# Patient Record
Sex: Male | Born: 1985 | Race: Black or African American | Hispanic: No | Marital: Single | Smoking: Former smoker
Health system: Southern US, Community
[De-identification: ages and names within clinical notes are randomized; demographics above are authoritative.]

## PROBLEM LIST (undated history)

## (undated) DIAGNOSIS — K219 Gastro-esophageal reflux disease without esophagitis: Secondary | ICD-10-CM

## (undated) DIAGNOSIS — T7840XA Allergy, unspecified, initial encounter: Secondary | ICD-10-CM

## (undated) HISTORY — DX: Allergy, unspecified, initial encounter: T78.40XA

## (undated) HISTORY — PX: DENTAL SURGERY: SHX609

## (undated) HISTORY — PX: MOUTH SURGERY: SHX715

---
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
## 2007-05-22 ENCOUNTER — Emergency Department: Payer: Self-pay | Admitting: Emergency Medicine

## 2007-05-22 ENCOUNTER — Other Ambulatory Visit: Payer: Self-pay

## 2007-05-24 ENCOUNTER — Emergency Department: Payer: Self-pay | Admitting: Emergency Medicine

## 2007-05-24 ENCOUNTER — Other Ambulatory Visit: Payer: Self-pay

## 2007-06-09 ENCOUNTER — Emergency Department: Payer: Self-pay

## 2007-10-08 ENCOUNTER — Emergency Department: Payer: Self-pay | Admitting: Emergency Medicine

## 2007-10-11 ENCOUNTER — Emergency Department: Payer: Self-pay | Admitting: Emergency Medicine

## 2008-02-13 ENCOUNTER — Emergency Department (HOSPITAL_COMMUNITY): Admission: EM | Admit: 2008-02-13 | Discharge: 2008-02-13 | Payer: Self-pay | Admitting: *Deleted

## 2008-02-24 ENCOUNTER — Emergency Department: Payer: Self-pay | Admitting: Emergency Medicine

## 2008-09-28 IMAGING — CT CT HEAD WITHOUT CONTRAST
1 series · 16 of 30 positions shown, 20 images · non-contrast
Comparison: none

REASON FOR EXAM: HA x 1 month
COMMENTS:

[Series 2: soft tissue · axial · 0.41mm/px · z∈[-176,-40]mm · 16 of 31 slices shown, 20 images]
[im 2/31  brain]
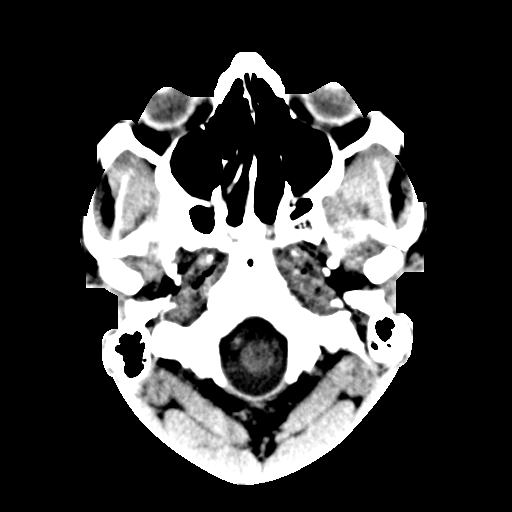
[im 2/31  bone]
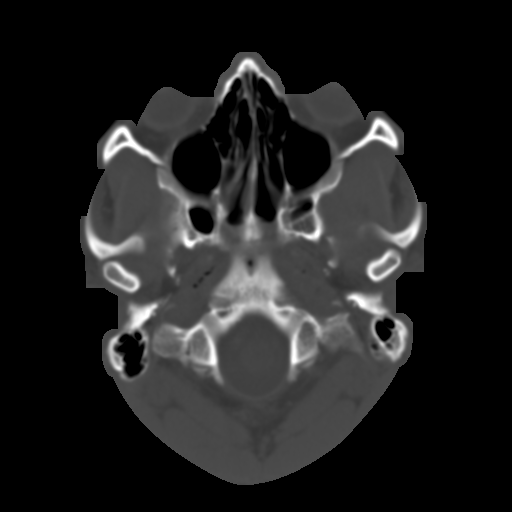
[im 4/31  brain]
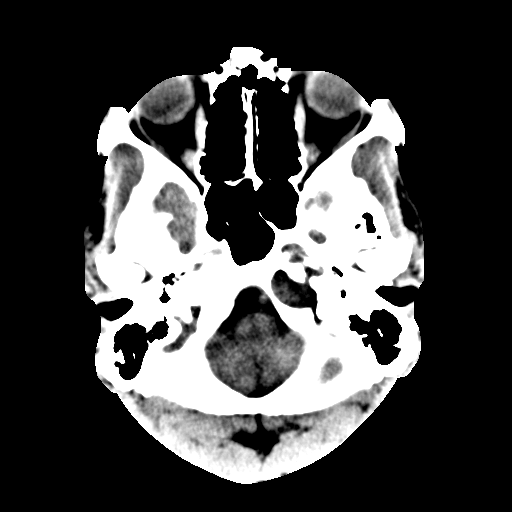
[im 6/31  brain]
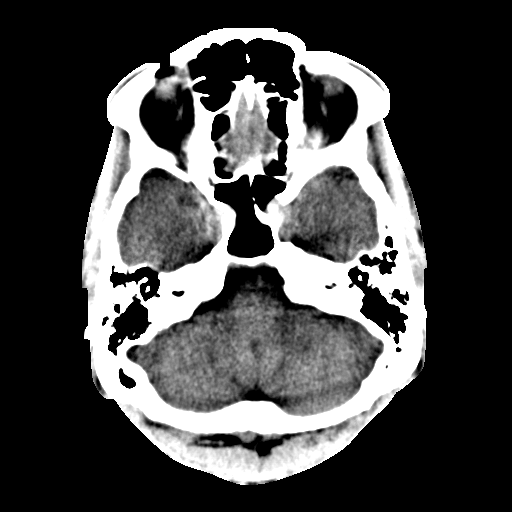
[im 8/31  brain]
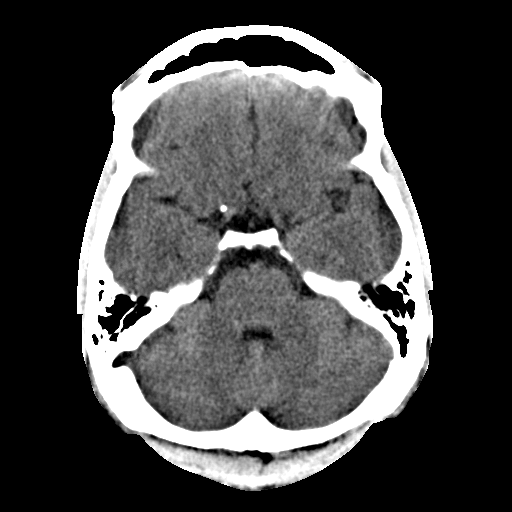
[im 9/31  brain]
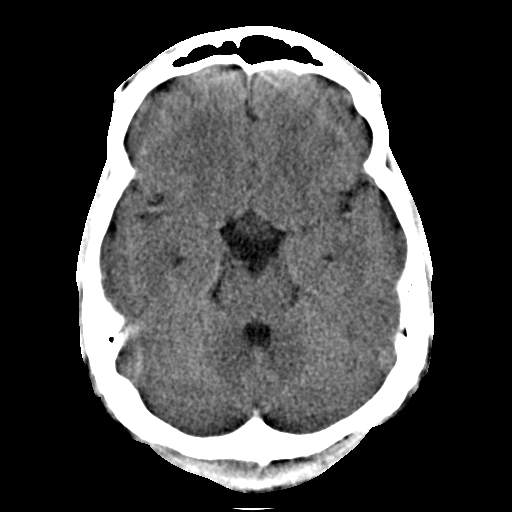
[im 9/31  bone]
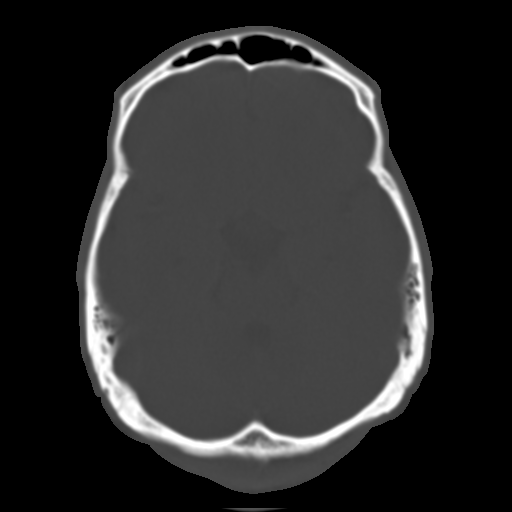
[im 11/31  brain]
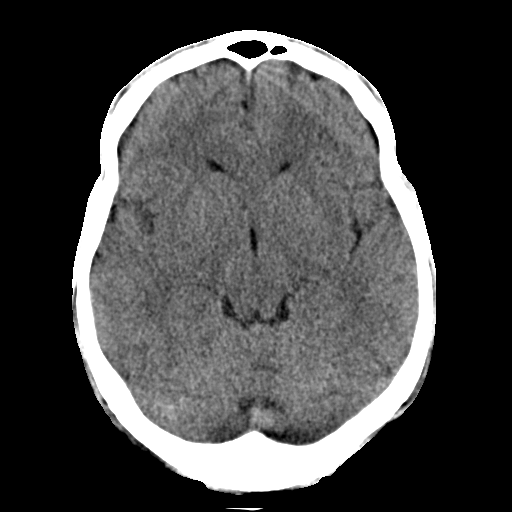
[im 13/31  brain]
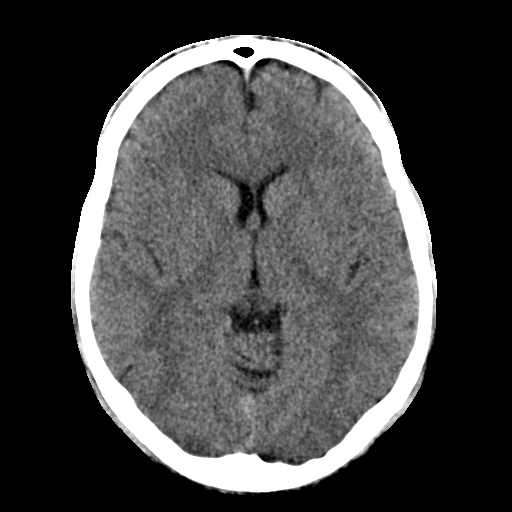
[im 15/31  brain]
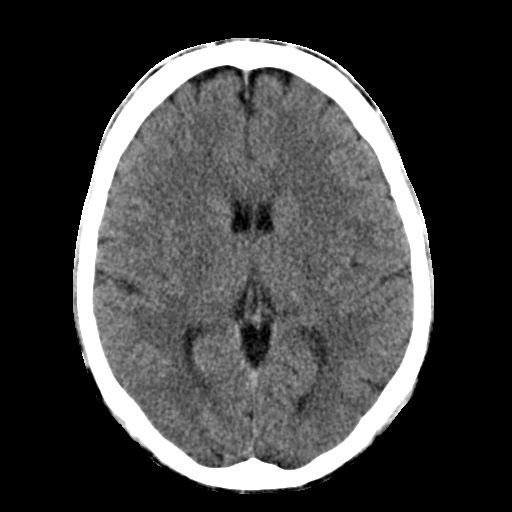
[im 16/31  brain]
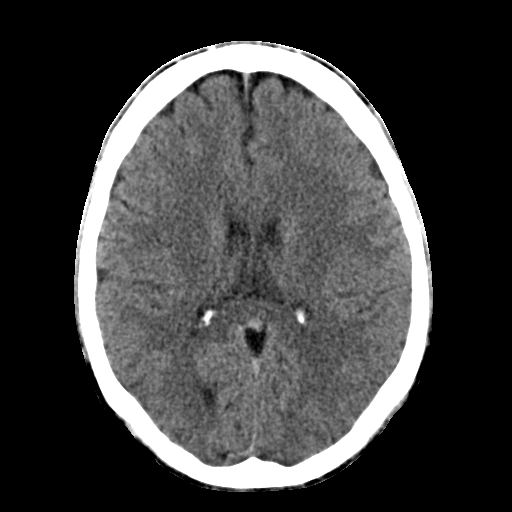
[im 16/31  bone]
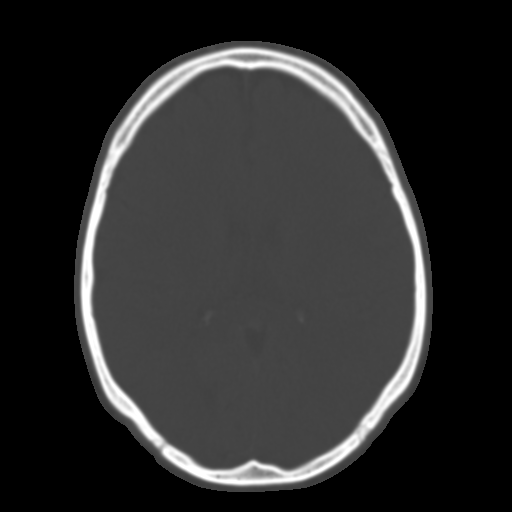
[im 18/31  brain]
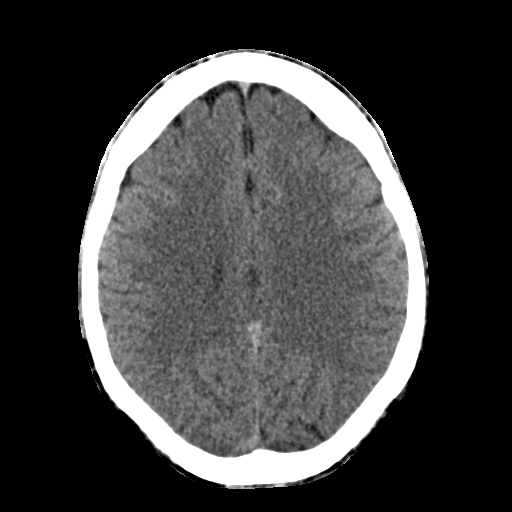
[im 20/31  brain]
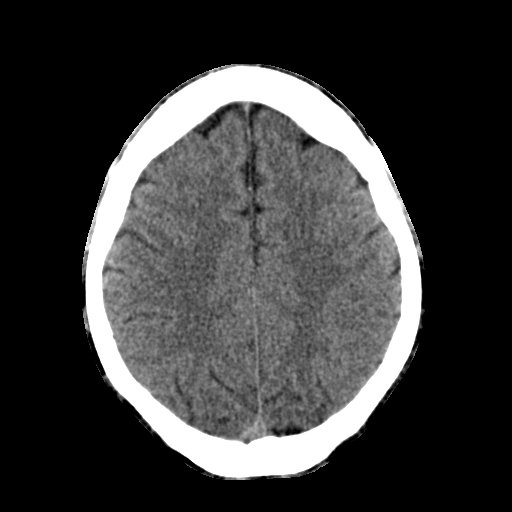
[im 22/31  brain]
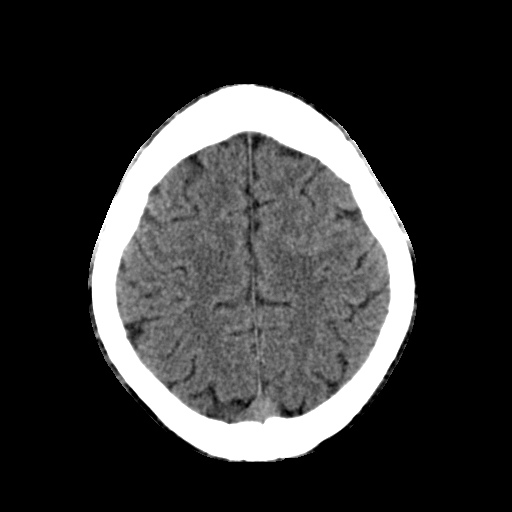
[im 23/31  brain]
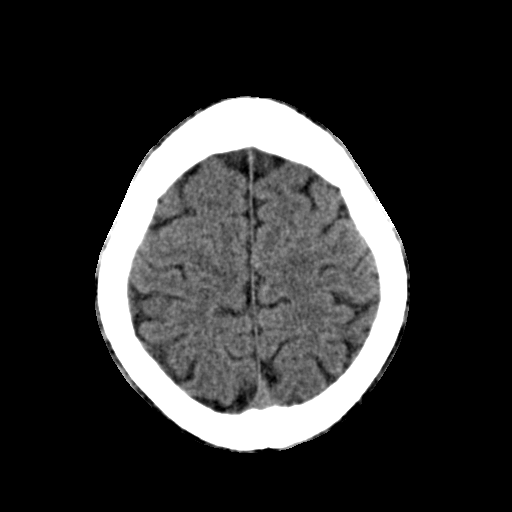
[im 23/31  bone]
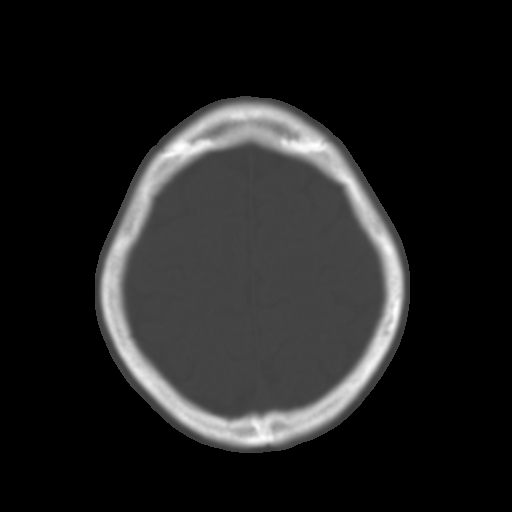
[im 25/31  brain]
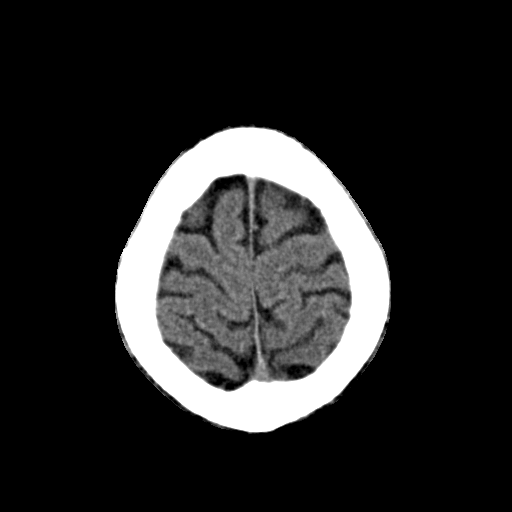
[im 27/31  brain]
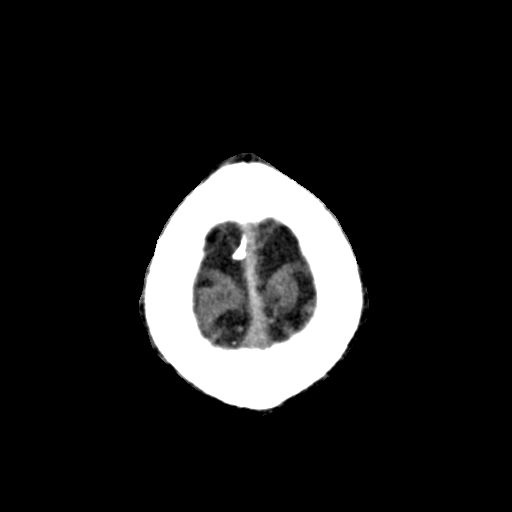
[im 29/31  brain]
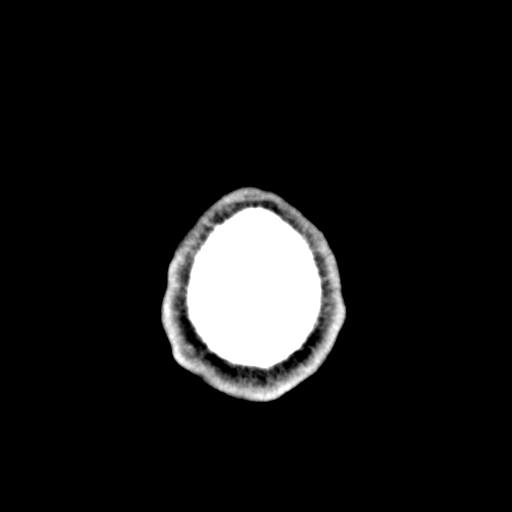

[16 of 30 positions shown; findings below may reference images not displayed]

PROCEDURE:     CT  - CT HEAD WITHOUT CONTRAST  - June 09, 2007  [DATE]

RESULT:     The ventricles are normal in size and position. There is no
intracranial hemorrhage, mass, or mass-effect. The cerebellum and brainstem
are normal in density. At bone window settings, the observed portions of the
paranasal sinuses are clear. The mastoid air cells are well pneumatized.
There is no lytic or blastic bony lesion.
IMPRESSION: I see no acute intracranial abnormality.

This report called to the [HOSPITAL] the conclusion of the
study.

## 2009-11-26 ENCOUNTER — Emergency Department: Payer: Self-pay | Admitting: Emergency Medicine

## 2010-08-04 ENCOUNTER — Emergency Department: Payer: Self-pay | Admitting: Emergency Medicine

## 2011-04-09 ENCOUNTER — Emergency Department (HOSPITAL_COMMUNITY)
Admission: EM | Admit: 2011-04-09 | Discharge: 2011-04-10 | Disposition: A | Discharge status: Home / Self Care | Payer: No Typology Code available for payment source | Attending: Emergency Medicine | Admitting: Emergency Medicine

## 2011-04-09 ENCOUNTER — Encounter: Payer: Self-pay | Admitting: Adult Health

## 2011-04-09 ENCOUNTER — Emergency Department (HOSPITAL_COMMUNITY): Payer: No Typology Code available for payment source

## 2011-04-09 DIAGNOSIS — Y92410 Unspecified street and highway as the place of occurrence of the external cause: Secondary | ICD-10-CM | POA: Insufficient documentation

## 2011-04-09 DIAGNOSIS — M542 Cervicalgia: Secondary | ICD-10-CM | POA: Insufficient documentation

## 2011-04-09 DIAGNOSIS — T1490XA Injury, unspecified, initial encounter: Secondary | ICD-10-CM | POA: Insufficient documentation

## 2011-04-09 MED ORDER — HYDROCODONE-ACETAMINOPHEN 5-325 MG PO TABS
1.0000 | ORAL_TABLET | Freq: Once | ORAL | Status: AC
  Administered 2011-04-09: 1 via ORAL
  Filled 2011-04-09: qty 1
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
## 2011-04-09 NOTE — ED Notes (Signed)
C/o of back and neck pain. No deformitites
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
## 2011-04-10 MED ORDER — HYDROCODONE-ACETAMINOPHEN 5-500 MG PO TABS: 1.0000 | ORAL_TABLET | Freq: Four times a day (QID) | ORAL | Status: AC | PRN

## 2011-04-10 MED ORDER — CYCLOBENZAPRINE HCL 10 MG PO TABS: 10.0000 mg | ORAL_TABLET | Freq: Two times a day (BID) | ORAL | Status: AC | PRN

## 2011-04-10 MED ORDER — IBUPROFEN 800 MG PO TABS: 800.0000 mg | ORAL_TABLET | Freq: Three times a day (TID) | ORAL | Status: AC
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
## 2013-01-18 ENCOUNTER — Emergency Department: Payer: Self-pay | Admitting: Internal Medicine

## 2013-01-18 LAB — COMPREHENSIVE METABOLIC PANEL
Albumin: 4.7 g/dL (ref 3.4–5.0)
Alkaline Phosphatase: 72 U/L (ref 50–136)
BUN: 17 mg/dL (ref 7–18)
Calcium, Total: 9.1 mg/dL (ref 8.5–10.1)
Chloride: 104 mmol/L (ref 98–107)
EGFR (Non-African Amer.): >60
Glucose: 97 mg/dL (ref 65–99)
SGOT(AST): 78 U/L — ABNORMAL HIGH (ref 15–37)
SGPT (ALT): 63 U/L (ref 12–78)
Sodium: 138 mmol/L (ref 136–145)
Total Protein: 7.7 g/dL (ref 6.4–8.2)

## 2013-01-18 LAB — CBC
HCT: 47.3 % (ref 40.0–52.0)
MCHC: 33.6 g/dL (ref 32.0–36.0)
MCV: 83 fL (ref 80–100)
Platelet: 210 10*3/uL (ref 150–440)

## 2013-01-18 LAB — URINALYSIS, COMPLETE
Bacteria: NONE SEEN
Bilirubin,UR: NEGATIVE
Leukocyte Esterase: NEGATIVE
Protein: NEGATIVE
RBC,UR: 1 /HPF (ref 0–5)
Specific Gravity: 1.025 (ref 1.003–1.030)
Squamous Epithelial: <1
WBC UR: 3 /HPF (ref 0–5)

## 2013-01-18 LAB — LIPASE, BLOOD: Lipase: 222 U/L (ref 73–393)

## 2013-11-17 ENCOUNTER — Emergency Department: Payer: Self-pay | Admitting: Emergency Medicine

## 2014-01-12 ENCOUNTER — Emergency Department: Payer: Self-pay | Admitting: Emergency Medicine

## 2014-03-04 ENCOUNTER — Emergency Department: Payer: Self-pay | Admitting: Emergency Medicine

## 2014-03-04 LAB — GC/CHLAMYDIA PROBE AMP

## 2014-11-04 ENCOUNTER — Emergency Department (HOSPITAL_COMMUNITY)
Admission: EM | Admit: 2014-11-04 | Discharge: 2014-11-04 | Disposition: A | Discharge status: Home / Self Care | Payer: Self-pay | Attending: Emergency Medicine | Admitting: Emergency Medicine

## 2014-11-04 ENCOUNTER — Emergency Department (HOSPITAL_COMMUNITY): Payer: Self-pay

## 2014-11-04 ENCOUNTER — Encounter (HOSPITAL_COMMUNITY): Payer: Self-pay

## 2014-11-04 DIAGNOSIS — K2970 Gastritis, unspecified, without bleeding: Secondary | ICD-10-CM | POA: Insufficient documentation

## 2014-11-04 DIAGNOSIS — R109 Unspecified abdominal pain: Secondary | ICD-10-CM

## 2014-11-04 DIAGNOSIS — Z87891 Personal history of nicotine dependence: Secondary | ICD-10-CM | POA: Insufficient documentation

## 2014-11-04 DIAGNOSIS — R079 Chest pain, unspecified: Secondary | ICD-10-CM | POA: Insufficient documentation

## 2014-11-04 LAB — CBC WITH DIFFERENTIAL/PLATELET
BASOS PCT: 0 % (ref 0–1)
Basophils Absolute: 0 10*3/uL (ref 0.0–0.1)
EOS PCT: 1 % (ref 0–5)
Eosinophils Absolute: 0.1 10*3/uL (ref 0.0–0.7)
HCT: 43.1 % (ref 39.0–52.0)
HEMOGLOBIN: 15 g/dL (ref 13.0–17.0)
LYMPHS ABS: 1.6 10*3/uL (ref 0.7–4.0)
Lymphocytes Relative: 27 % (ref 12–46)
MCH: 28.7 pg (ref 26.0–34.0)
MCHC: 34.8 g/dL (ref 30.0–36.0)
MCV: 82.4 fL (ref 78.0–100.0)
MONO ABS: 0.4 10*3/uL (ref 0.1–1.0)
MONOS PCT: 7 % (ref 3–12)
NEUTROS ABS: 3.8 10*3/uL (ref 1.7–7.7)
NEUTROS PCT: 65 % (ref 43–77)
PLATELETS: 176 10*3/uL (ref 150–400)
RBC: 5.23 MIL/uL (ref 4.22–5.81)
RDW: 13.2 % (ref 11.5–15.5)
WBC: 5.8 10*3/uL (ref 4.0–10.5)

## 2014-11-04 LAB — COMPREHENSIVE METABOLIC PANEL WITH GFR
ALT: 19 U/L (ref 17–63)
AST: 20 U/L (ref 15–41)
Albumin: 4 g/dL (ref 3.5–5.0)
Alkaline Phosphatase: 59 U/L (ref 38–126)
Anion gap: 6 (ref 5–15)
BUN: 8 mg/dL (ref 6–20)
CO2: 31 mmol/L (ref 22–32)
Calcium: 9 mg/dL (ref 8.9–10.3)
Chloride: 101 mmol/L (ref 101–111)
Creatinine, Ser: 1.16 mg/dL (ref 0.61–1.24)
GFR calc Af Amer: >60 mL/min
GFR calc non Af Amer: >60 mL/min
Glucose, Bld: 121 mg/dL — ABNORMAL HIGH (ref 65–99)
Potassium: 3.6 mmol/L (ref 3.5–5.1)
Sodium: 138 mmol/L (ref 135–145)
Total Bilirubin: 1.6 mg/dL — ABNORMAL HIGH (ref 0.3–1.2)
Total Protein: 6.4 g/dL — ABNORMAL LOW (ref 6.5–8.1)

## 2014-11-04 LAB — LIPASE, BLOOD: LIPASE: 39 U/L (ref 22–51)

## 2014-11-04 LAB — I-STAT TROPONIN, ED: Troponin i, poc: 0 ng/mL (ref 0.00–0.08)

## 2014-11-04 MED ORDER — GI COCKTAIL ~~LOC~~
30.0000 mL | Freq: Once | ORAL | Status: AC
  Administered 2014-11-04: 30 mL via ORAL
  Filled 2014-11-04: qty 30

## 2014-11-04 MED ORDER — LANSOPRAZOLE 30 MG PO CPDR: 30.0000 mg | DELAYED_RELEASE_CAPSULE | Freq: Every day | ORAL | Status: DC

## 2014-11-04 NOTE — ED Provider Notes (Signed)
CSN: [id number]     Arrival date & time 11/04/14  0824 History   First MD Initiated Contact with Patient 11/04/14 [phone]     Chief Complaint  Patient presents with  . Chest Pain     (Consider location/radiation/quality/duration/timing/severity/associated sxs/prior Treatment) The history is provided by the patient.   Colin Glass is a 29 y.o. male presenting with a several month history of chest pain and dysphagia type symptoms, describing he frequently has midsternal chest pain triggered by bolus's of food and frequently has to drink water behind a food bolus to get it to pass.  He describes his symptoms are worse at night, describing a burning sensation and tightness in his chest, describing pain sometimes into his throat.  He denies frank reflux.  He denies shortness of breath.  He does have a recent history of EtOH use and smoking, states he quit using both of these products 3 weeks ago because of his symptoms with no significant improvement.  He has used omeprazole for the past 6 days, given by his father with no significant improvement.  Has had no nausea or vomiting, denies diarrhea, no GI bleeding.    History reviewed. No pertinent past medical history. History reviewed. No pertinent past surgical history. History reviewed. No pertinent family history. History  Substance Use Topics  . Smoking status: Former Smoker    Quit date: 10/21/2014  . Smokeless tobacco: Not on file  . Alcohol Use: No    Review of Systems  Constitutional: Negative.  Negative for fever and chills.  HENT: Negative.  Negative for congestion and sore throat.   Eyes: Negative.   Respiratory: Positive for chest tightness. Negative for shortness of breath.   Cardiovascular: Negative for chest pain.  Gastrointestinal: Positive for abdominal pain. Negative for nausea, vomiting and abdominal distention.  Genitourinary: Negative.  Negative for dysuria.  Musculoskeletal: Negative for joint swelling, arthralgias  and neck pain.  Skin: Negative.  Negative for rash and wound.  Neurological: Negative for dizziness, weakness, light-headedness, numbness and headaches.  Psychiatric/Behavioral: Negative.       Allergies  Review of patient's allergies indicates no known allergies.  Home Medications   Prior to Admission medications   Medication Sig Start Date End Date Taking? Authorizing Provider  albuterol (PROVENTIL HFA;VENTOLIN HFA) 108 (90 BASE) MCG/ACT inhaler Inhale 1-2 puffs into the lungs every 6 (six) hours as needed for wheezing or shortness of breath.   Yes Historical Provider, MD  ranitidine (ZANTAC) 75 MG tablet Take 75 mg by mouth as needed for heartburn.   Yes Historical Provider, MD  lansoprazole (PREVACID) 30 MG capsule Take 1 capsule (30 mg total) by mouth daily at 12 noon. 11/04/14   Evalee Jefferson, PA-C   BP 101/62 mmHg  Pulse 59  Temp(Src) 98.1 F (36.7 C) (Oral)  Resp 16  Ht 6\' 1"  (1.854 m)  Wt 180 lb (81.647 kg)  BMI 23.75 kg/m2  SpO2 99% Physical Exam  Constitutional: He appears well-developed and well-nourished.  HENT:  Head: Normocephalic and atraumatic.  Eyes: Conjunctivae are normal.  Neck: Normal range of motion.  Cardiovascular: Normal rate, regular rhythm, normal heart sounds and intact distal pulses.   Pulmonary/Chest: Effort normal and breath sounds normal. He has no wheezes. He exhibits no tenderness.  Abdominal: Soft. Bowel sounds are normal. He exhibits no distension. There is no tenderness. There is no rebound and no guarding.  Musculoskeletal: Normal range of motion.  Neurological: He is alert.  Skin: Skin is warm and  dry.  Psychiatric: He has a normal mood and affect.  Nursing note and vitals reviewed.   ED Course  Procedures (including critical care time) Labs Review Labs Reviewed  COMPREHENSIVE METABOLIC PANEL - Abnormal; Notable for the following:    Glucose, Bld 121 (*)    Total Protein 6.4 (*)    Total Bilirubin 1.6 (*)    All other components  within normal limits  CBC WITH DIFFERENTIAL/PLATELET  LIPASE, BLOOD  I-STAT TROPOININ, ED    Imaging Review Dg Abd Acute W/chest  11/04/2014   CLINICAL DATA:  Two-month history of mid chest pain and upper abdominal pain. Pain primarily post-prandial  EXAM: DG ABDOMEN ACUTE W/ 1V CHEST  COMPARISON:  Chest radiograph March 04, 2014  FINDINGS: PA chest: Lungs are clear. Heart size and pulmonary vascularity are normal. No adenopathy.  Supine and upright abdomen: There is moderate stool in the colon. There is no bowel dilatation or air-fluid level suggesting obstruction. No free air. There are phleboliths in the pelvis.  IMPRESSION: Bowel gas pattern unremarkable. No obstruction or free air. Lungs clear.   Electronically Signed   By: Lowella Grip III M.D.   On: 11/04/2014 09:58     EKG Interpretation   Date/Time:  Thursday November 04 2014 08:40:25 EDT Ventricular Rate:  62 PR Interval:  175 QRS Duration: 91 QT Interval:  396 QTC Calculation: 402 R Axis:   54 Text Interpretation:  Sinus rhythm RSR' in V1 or V2, probably normal  variant No significant change since last tracing Confirmed by HARRISON   MD, [place] ([id number]) on 11/04/2014 8:45:04 AM      MDM   Final diagnoses:  Abdominal pain  Gastritis    Patient was given a GI cocktail which improved symptoms for approximately 30 minutes.  Patients labs and/or radiological studies were reviewed and considered during the medical decision making and disposition process.  Results were also discussed with patient. Patient's labs are stable.  History and response to GI cocktail just acid reflux disease.  Acute abdominal series reveals no abnormal bowel gas pattern, no free air.  He has no guarding on exam.  He may have peptic ulcer disease but no suggestion of perforation.  More likely has generalized gastritis with reflux.  He will be placed on Protonix given a home trial of omeprazole was not helpful.  We'll also recommend Maalox or Mylanta  prior to meals.  The wellness clinic for follow-up care.  The patient appears reasonably screened and/or stabilized for discharge and I doubt any other medical condition or other [facility] requiring further screening, evaluation, or treatment in the ED at this time prior to discharge.     Evalee Jefferson, PA-C 11/04/14 1157  Pamella Pert, MD 11/04/14 1550

## 2014-11-04 NOTE — ED Notes (Signed)
Dr Harrison at bedside

## 2014-11-04 NOTE — ED Notes (Signed)
Pt reports central chest pain that radiates to throat that has been present for several months.  Pt reports it occurs after he eats and its worse at night.  Pain is described as a burning sensation.

## 2014-11-04 NOTE — Discharge Instructions (Signed)
Gastritis, Adult Gastritis is soreness and swelling (inflammation) of the lining of the stomach. Gastritis can develop as a sudden onset (acute) or long-term (chronic) condition. If gastritis is not treated, it can lead to stomach bleeding and ulcers. CAUSES  Gastritis occurs when the stomach lining is weak or damaged. Digestive juices from the stomach then inflame the weakened stomach lining. The stomach lining may be weak or damaged due to viral or bacterial infections. One common bacterial infection is the Helicobacter pylori infection. Gastritis can also result from excessive alcohol consumption, taking certain medicines, or having too much acid in the stomach.  SYMPTOMS  In some cases, there are no symptoms. When symptoms are present, they may include:  Pain or a burning sensation in the upper abdomen.  Nausea.  Vomiting.  An uncomfortable feeling of fullness after eating. DIAGNOSIS  Your caregiver may suspect you have gastritis based on your symptoms and a physical exam. To determine the cause of your gastritis, your caregiver may perform the following:  Blood or stool tests to check for the H pylori bacterium.  Gastroscopy. A thin, flexible tube (endoscope) is passed down the esophagus and into the stomach. The endoscope has a light and camera on the end. Your caregiver uses the endoscope to view the inside of the stomach.  Taking a tissue sample (biopsy) from the stomach to examine under a microscope. TREATMENT  Depending on the cause of your gastritis, medicines may be prescribed. If you have a bacterial infection, such as an H pylori infection, antibiotics may be given. If your gastritis is caused by too much acid in the stomach, H2 blockers or antacids may be given. Your caregiver may recommend that you stop taking aspirin, ibuprofen, or other nonsteroidal anti-inflammatory drugs (NSAIDs). HOME CARE INSTRUCTIONS  Only take over-the-counter or prescription medicines as directed by  your caregiver.  If you were given antibiotic medicines, take them as directed. Finish them even if you start to feel better.  Drink enough fluids to keep your urine clear or pale yellow.  Avoid foods and drinks that make your symptoms worse, such as:  Caffeine or alcoholic drinks.  Chocolate.  Peppermint or mint flavorings.  Garlic and onions.  Spicy foods.  Citrus fruits, such as oranges, lemons, or limes.  Tomato-based foods such as sauce, chili, salsa, and pizza.  Fried and fatty foods.  Eat small, frequent meals instead of large meals. SEEK IMMEDIATE MEDICAL CARE IF:   You have black or dark red stools.  You vomit blood or material that looks like coffee grounds.  You are unable to keep fluids down.  Your abdominal pain gets worse.  You have a fever.  You do not feel better after 1 week.  You have any other questions or concerns. MAKE SURE YOU:  Understand these instructions.  Will watch your condition.  Will get help right away if you are not doing well or get worse. Document Released: 04/17/2001 Document Revised: 10/23/2011 Document Reviewed: 06/06/2011 [facility] Patient Information 2015 [place], [place]. This information is not intended to replace advice given to you by your health care provider. Make sure you discuss any questions you have with your health care provider.   Use the medicine prescribed daily.  In addition,  I recommend taking either Maalox or Mylanta 3 times daily before meals and another dose before bedtime until your symptoms are improved.  You should get follow-up care is discussed and have been referred to clinic for the care.  In the interim return  here if you develop any worsening symptoms.

## 2015-03-21 ENCOUNTER — Encounter: Payer: Self-pay | Admitting: Emergency Medicine

## 2015-03-21 ENCOUNTER — Emergency Department
Admission: EM | Admit: 2015-03-21 | Discharge: 2015-03-21 | Disposition: A | Discharge status: Home / Self Care | Payer: Self-pay | Attending: Emergency Medicine | Admitting: Emergency Medicine

## 2015-03-21 DIAGNOSIS — S161XXA Strain of muscle, fascia and tendon at neck level, initial encounter: Secondary | ICD-10-CM | POA: Insufficient documentation

## 2015-03-21 DIAGNOSIS — Y9289 Other specified places as the place of occurrence of the external cause: Secondary | ICD-10-CM | POA: Insufficient documentation

## 2015-03-21 DIAGNOSIS — Z79899 Other long term (current) drug therapy: Secondary | ICD-10-CM | POA: Insufficient documentation

## 2015-03-21 DIAGNOSIS — S299XXA Unspecified injury of thorax, initial encounter: Secondary | ICD-10-CM | POA: Insufficient documentation

## 2015-03-21 DIAGNOSIS — X58XXXA Exposure to other specified factors, initial encounter: Secondary | ICD-10-CM | POA: Insufficient documentation

## 2015-03-21 DIAGNOSIS — Z87891 Personal history of nicotine dependence: Secondary | ICD-10-CM | POA: Insufficient documentation

## 2015-03-21 DIAGNOSIS — Y998 Other external cause status: Secondary | ICD-10-CM | POA: Insufficient documentation

## 2015-03-21 DIAGNOSIS — Y9389 Activity, other specified: Secondary | ICD-10-CM | POA: Insufficient documentation

## 2015-03-21 MED ORDER — HYDROCODONE-ACETAMINOPHEN 5-325 MG PO TABS: 1.0000 | ORAL_TABLET | ORAL | Status: DC | PRN

## 2015-03-21 MED ORDER — NAPROXEN 500 MG PO TABS
ORAL_TABLET | ORAL | Status: AC
  Administered 2015-03-21: 500 mg
  Filled 2015-03-21: qty 1

## 2015-03-21 MED ORDER — NAPROXEN SODIUM 550 MG PO TABS: 500.0000 mg | ORAL_TABLET | Freq: Two times a day (BID) | ORAL | Status: DC

## 2015-03-21 MED ORDER — IBUPROFEN 800 MG PO TABS: 800.0000 mg | ORAL_TABLET | Freq: Three times a day (TID) | ORAL | Status: DC | PRN

## 2015-03-21 MED ORDER — CYCLOBENZAPRINE HCL 10 MG PO TABS: 10.0000 mg | ORAL_TABLET | Freq: Three times a day (TID) | ORAL | Status: DC | PRN

## 2015-03-21 NOTE — ED Provider Notes (Signed)
[facility] Emergency Department Provider Note  ____________________________________________  Time seen: Approximately 11:33 AM  I have reviewed the triage vital signs and the nursing notes.   HISTORY  Chief Complaint Neck Pain and Back Pain    HPI Colin Glass is a 29 y.o. male resents with 2 day history of neck pain radiates down his back. Patient denies any known trauma just states that he is unable to turn his head from side-to-side or up and down.Scars pain is worst pain ever 10 over 10.   History reviewed. No pertinent past medical history.  There are no active problems to display for this patient.   History reviewed. No pertinent past surgical history.  Current Outpatient Rx  Name  Route  Sig  Dispense  Refill  . albuterol (PROVENTIL HFA;VENTOLIN HFA) 108 (90 BASE) MCG/ACT inhaler   Inhalation   Inhale 1-2 puffs into the lungs every 6 (six) hours as needed for wheezing or shortness of breath.         . cyclobenzaprine (FLEXERIL) 10 MG tablet   Oral   Take 1 tablet (10 mg total) by mouth every 8 (eight) hours as needed for muscle spasms.   30 tablet   1   . HYDROcodone-acetaminophen (NORCO) 5-325 MG tablet   Oral   Take 1-2 tablets by mouth every 4 (four) hours as needed for moderate pain.   15 tablet   0   . ibuprofen (ADVIL,MOTRIN) 800 MG tablet   Oral   Take 1 tablet (800 mg total) by mouth every 8 (eight) hours as needed.   30 tablet   0   . lansoprazole (PREVACID) 30 MG capsule   Oral   Take 1 capsule (30 mg total) by mouth daily at 12 noon.   30 capsule   0   . ranitidine (ZANTAC) 75 MG tablet   Oral   Take 75 mg by mouth as needed for heartburn.           Allergies Review of patient's allergies indicates no known allergies.  History reviewed. No pertinent family history.  Social History Social History  Substance Use Topics  . Smoking status: Former Smoker    Quit date: 10/21/2014  . Smokeless tobacco:  None  . Alcohol Use: No    Review of Systems Constitutional: No fever/chills Eyes: No visual changes. ENT: No sore throat. Cardiovascular: Denies chest pain. Respiratory: Denies shortness of breath. Gastrointestinal: No abdominal pain.  No nausea, no vomiting.  No diarrhea.  No constipation. Genitourinary: Negative for dysuria. Musculoskeletal: Positive for neck and back pain. Skin: Negative for rash. Neurological: Negative for headaches, focal weakness or numbness.  10-point ROS otherwise negative.  ____________________________________________   PHYSICAL EXAM:  VITAL SIGNS: ED Triage Vitals  Enc Vitals Group     BP 03/21/15 1110 110/62 mmHg     Pulse Rate 03/21/15 1110 82     Resp 03/21/15 1110 20     Temp 03/21/15 1110 97.7 F (36.5 C)     Temp Source 03/21/15 1110 Oral     SpO2 03/21/15 1110 99 %     Weight 03/21/15 1110 184 lb (83.462 kg)     Height 03/21/15 1110 6\' 2"  (1.88 m)     Head Cir --      Peak Flow --      Pain Score 03/21/15 1110 10     Pain Loc --      Pain Edu? --      Excl.  in [place]? --     Constitutional: Alert and oriented. Well appearing and in no acute distress. Eyes: Conjunctivae are normal. PERRL. EOMI. Head: Atraumatic. Nose: No congestion/rhinnorhea. Mouth/Throat: Mucous membranes are moist.  Oropharynx non-erythematous. Neck: No stridor.  No cervical spinal tenderness to palpation however positive paraspinal tenderness. Cardiovascular: Normal rate, regular rhythm. Grossly normal heart sounds.  Good peripheral circulation. Respiratory: Normal respiratory effort.  No retractions. Lungs CTAB. Musculoskeletal: No lower extremity tenderness nor edema.  No joint effusions. Neurologic:  Normal speech and language. No gross focal neurologic deficits are appreciated. No gait instability. Skin:  Skin is warm, dry and intact. No rash noted. Psychiatric: Mood and affect are normal. Speech and behavior are  normal.  ____________________________________________   LABS (all labs ordered are listed, but only abnormal results are displayed)  Labs Reviewed - No data to display ____________________________________________   PROCEDURES  Procedure(s) performed: None  Critical Care performed: No  ____________________________________________   INITIAL IMPRESSION / ASSESSMENT AND PLAN / ED COURSE  Pertinent labs & imaging results that were available during my care of the patient were reviewed by me and considered in my medical decision making (see chart for details).  Acute cervical myofascial strain radiating down to the lumbar paraspinal strain. Rx given for Flexeril 10 mg 3 times a day and ibuprofen/Anaprox for inflammation. Patient follow-up PCP or return to the ER with any worsening symptomology. ____________________________________________   FINAL CLINICAL IMPRESSION(S) / ED DIAGNOSES  Final diagnoses:  Cervical strain, acute, initial encounter      Arlyss Repress, PA-C 03/21/15 1144  Lavonia Drafts, MD 03/21/15 1356

## 2015-03-21 NOTE — ED Notes (Signed)
Pt to ed with c/o neck pain that radiates down to lower back.  X 2 days.

## 2015-03-21 NOTE — Discharge Instructions (Signed)
Cervical Sprain A cervical sprain is an injury in the neck in which the strong, fibrous tissues (ligaments) that connect your neck bones stretch or tear. Cervical sprains can range from mild to severe. Severe cervical sprains can cause the neck vertebrae to be unstable. This can lead to damage of the spinal cord and can result in serious nervous system problems. The amount of time it takes for a cervical sprain to get better depends on the cause and extent of the injury. Most cervical sprains heal in 1 to 3 weeks. CAUSES  Severe cervical sprains may be caused by:   Contact sport injuries (such as from football, rugby, wrestling, hockey, auto racing, gymnastics, diving, martial arts, or boxing).   Motor vehicle collisions.   Whiplash injuries. This is an injury from a sudden forward and backward whipping movement of the head and neck.  Falls.  Mild cervical sprains may be caused by:   Being in an awkward position, such as while cradling a telephone between your ear and shoulder.   Sitting in a chair that does not offer proper support.   Working at a poorly Landscape [employer] station.   Looking up or down for long periods of time.  SYMPTOMS   Pain, soreness, stiffness, or a burning sensation in the front, back, or sides of the neck. This discomfort may develop immediately after the injury or slowly, 24 hours or more after the injury.   Pain or tenderness directly in the middle of the back of the neck.   Shoulder or upper back pain.   Limited ability to move the neck.   Headache.   Dizziness.   Weakness, numbness, or tingling in the hands or arms.   Muscle spasms.   Difficulty swallowing or chewing.   Tenderness and swelling of the neck.  DIAGNOSIS  Most of the time your health care provider can diagnose a cervical sprain by taking your history and doing a physical exam. Your health care provider will ask about previous neck injuries and any known neck  problems, such as arthritis in the neck. X-rays may be taken to find out if there are any other problems, such as with the bones of the neck. Other tests, such as a CT scan or MRI, may also be needed.  TREATMENT  Treatment depends on the severity of the cervical sprain. Mild sprains can be treated with rest, keeping the neck in place (immobilization), and pain medicines. Severe cervical sprains are immediately immobilized. Further treatment is done to help with pain, muscle spasms, and other symptoms and may include:  Medicines, such as pain relievers, numbing medicines, or muscle relaxants.   Physical therapy. This may involve stretching exercises, strengthening exercises, and posture training. Exercises and improved posture can help stabilize the neck, strengthen muscles, and help stop symptoms from returning.  HOME CARE INSTRUCTIONS   Put ice on the injured area.   Put ice in a plastic bag.   Place a towel between your skin and the bag.   Leave the ice on for 15-20 minutes, 3-4 times a day.   If your injury was severe, you may have been given a cervical collar to wear. A cervical collar is a two-piece collar designed to keep your neck from moving while it heals.  Do not remove the collar unless instructed by your health care provider.  If you have long hair, keep it outside of the collar.  Ask your health care provider before making any adjustments to your collar. Minor  adjustments may be required over time to improve comfort and reduce pressure on your chin or on the back of your head.  Ifyou are allowed to remove the collar for cleaning or bathing, follow your health care provider's instructions on how to do so safely.  Keep your collar clean by wiping it with mild soap and water and drying it completely. If the collar you have been given includes removable pads, remove them every 1-2 days and hand wash them with soap and water. Allow them to air dry. They should be completely  dry before you wear them in the collar.  If you are allowed to remove the collar for cleaning and bathing, wash and dry the skin of your neck. Check your skin for irritation or sores. If you see any, tell your health care provider.  Do not drive while wearing the collar.   Only take over-the-counter or prescription medicines for pain, discomfort, or fever as directed by your health care provider.   Keep all follow-up appointments as directed by your health care provider.   Keep all physical therapy appointments as directed by your health care provider.   Make any needed adjustments to your workstation to promote good posture.   Avoid positions and activities that make your symptoms worse.   Warm up and stretch before being active to help prevent problems.  SEEK MEDICAL CARE IF:   Your pain is not controlled with medicine.   You are unable to decrease your pain medicine over time as planned.   Your activity level is not improving as expected.  SEEK IMMEDIATE MEDICAL CARE IF:   You develop any bleeding.  You develop stomach upset.  You have signs of an allergic reaction to your medicine.   Your symptoms get worse.   You develop new, unexplained symptoms.   You have numbness, tingling, weakness, or paralysis in any part of your body.  MAKE SURE YOU:   Understand these instructions.  Will watch your condition.  Will get help right away if you are not doing well or get worse.   This information is not intended to replace advice given to you by your health care provider. Make sure you discuss any questions you have with your health care provider.   Document Released: 02/18/2007 Document Revised: 04/28/2013 Document Reviewed: 10/29/2012 Elsevier Interactive Patient Education 2016 [place].  Cryotherapy Cryotherapy means treatment with cold. Ice or gel packs can be used to reduce both pain and swelling. Ice is the most helpful within the first 24 to 48  hours after an injury or flare-up from overusing a muscle or joint. Sprains, strains, spasms, burning pain, shooting pain, and aches can all be eased with ice. Ice can also be used when recovering from surgery. Ice is effective, has very few side effects, and is safe for most people to use. PRECAUTIONS  Ice is not a safe treatment option for people with:  Raynaud phenomenon. This is a condition affecting small blood vessels in the extremities. Exposure to cold may cause your problems to return.  Cold hypersensitivity. There are many forms of cold hypersensitivity, including:  Cold urticaria. Red, itchy hives appear on the skin when the tissues begin to warm after being iced.  Cold erythema. This is a red, itchy rash caused by exposure to cold.  Cold hemoglobinuria. Red blood cells break down when the tissues begin to warm after being iced. The hemoglobin that carry oxygen are passed into the urine because they cannot combine with  blood proteins fast enough.  Numbness or altered sensitivity in the area being iced. If you have any of the following conditions, do not use ice until you have discussed cryotherapy with your caregiver:  Heart conditions, such as arrhythmia, angina, or chronic heart disease.  High blood pressure.  Healing wounds or open skin in the area being iced.  Current infections.  Rheumatoid arthritis.  Poor circulation.  Diabetes. Ice slows the blood flow in the region it is applied. This is beneficial when trying to stop inflamed tissues from spreading irritating chemicals to surrounding tissues. However, if you expose your skin to cold temperatures for too long or without the proper protection, you can damage your skin or nerves. Watch for signs of skin damage due to cold. HOME CARE INSTRUCTIONS Follow these tips to use ice and cold packs safely.  Place a dry or damp towel between the ice and skin. A damp towel will cool the skin more quickly, so you may need to  shorten the time that the ice is used.  For a more rapid response, add gentle compression to the ice.  Ice for no more than 10 to 20 minutes at a time. The bonier the area you are icing, the less time it will take to get the benefits of ice.  Check your skin after 5 minutes to make sure there are no signs of a poor response to cold or skin damage.  Rest 20 minutes or more between uses.  Once your skin is numb, you can end your treatment. You can test numbness by very lightly touching your skin. The touch should be so light that you do not see the skin dimple from the pressure of your fingertip. When using ice, most people will feel these normal sensations in this order: cold, burning, aching, and numbness.  Do not use ice on someone who cannot communicate their responses to pain, such as small children or people with dementia. HOW TO MAKE AN ICE PACK Ice packs are the most common way to use ice therapy. Other methods include ice massage, ice baths, and cryosprays. Muscle creams that cause a cold, tingly feeling do not offer the same benefits that ice offers and should not be used as a substitute unless recommended by your caregiver. To make an ice pack, do one of the following:  Place crushed ice or a bag of frozen vegetables in a sealable plastic bag. Squeeze out the excess air. Place this bag inside another plastic bag. Slide the bag into a pillowcase or place a damp towel between your skin and the bag.  Mix 3 parts water with 1 part rubbing alcohol. Freeze the mixture in a sealable plastic bag. When you remove the mixture from the freezer, it will be slushy. Squeeze out the excess air. Place this bag inside another plastic bag. Slide the bag into a pillowcase or place a damp towel between your skin and the bag. SEEK MEDICAL CARE IF:  You develop white spots on your skin. This may give the skin a blotchy (mottled) appearance.  Your skin turns blue or pale.  Your skin becomes waxy or  hard.  Your swelling gets worse. MAKE SURE YOU:   Understand these instructions.  Will watch your condition.  Will get help right away if you are not doing well or get worse.   This information is not intended to replace advice given to you by your health care provider. Make sure you discuss any questions you have with  your health care provider.   Document Released: 12/18/2010 Document Revised: 05/14/2014 Document Reviewed: 12/18/2010 Elsevier Interactive Patient Education 2016 [place].  Muscle Strain A muscle strain (pulled muscle) happens when a muscle is stretched beyond normal length. It happens when a sudden, violent force stretches your muscle too far. Usually, a few of the fibers in your muscle are torn. Muscle strain is common in athletes. Recovery usually takes 1-2 weeks. Complete healing takes 5-6 weeks.  HOME CARE   Follow the PRICE method of treatment to help your injury get better. Do this the first 2-3 days after the injury:  Protect. Protect the muscle to keep it from getting injured again.  Rest. Limit your activity and rest the injured body part.  Ice. Put ice in a plastic bag. Place a towel between your skin and the bag. Then, apply the ice and leave it on from 15-20 minutes each hour. After the third day, switch to moist heat packs.  Compression. Use a splint or elastic bandage on the injured area for comfort. Do not put it on too tightly.  Elevate. Keep the injured body part above the level of your heart.  Only take medicine as told by your doctor.  Warm up before doing exercise to prevent future muscle strains. GET HELP IF:   You have more pain or puffiness (swelling) in the injured area.  You feel numbness, tingling, or notice a loss of strength in the injured area. MAKE SURE YOU:   Understand these instructions.  Will watch your condition.  Will get help right away if you are not doing well or get worse.   This information is not intended  to replace advice given to you by your health care provider. Make sure you discuss any questions you have with your health care provider.   Document Released: 01/31/2008 Document Revised: 02/11/2013 Document Reviewed: 11/20/2012 Elsevier Interactive Patient Education [employer].

## 2015-04-11 ENCOUNTER — Encounter: Payer: Self-pay | Admitting: Emergency Medicine

## 2015-04-11 ENCOUNTER — Emergency Department
Admission: EM | Admit: 2015-04-11 | Discharge: 2015-04-11 | Disposition: A | Discharge status: Home / Self Care | Payer: Self-pay | Attending: Emergency Medicine | Admitting: Emergency Medicine

## 2015-04-11 DIAGNOSIS — J020 Streptococcal pharyngitis: Secondary | ICD-10-CM | POA: Insufficient documentation

## 2015-04-11 DIAGNOSIS — Z79899 Other long term (current) drug therapy: Secondary | ICD-10-CM | POA: Insufficient documentation

## 2015-04-11 DIAGNOSIS — F17210 Nicotine dependence, cigarettes, uncomplicated: Secondary | ICD-10-CM | POA: Insufficient documentation

## 2015-04-11 MED ORDER — AMOXICILLIN 500 MG PO CAPS: 500.0000 mg | ORAL_CAPSULE | Freq: Three times a day (TID) | ORAL | Status: DC

## 2015-04-11 NOTE — ED Provider Notes (Signed)
[facility] Emergency Department Provider Note  ____________________________________________  Time seen: Approximately 8:47 AM  I have reviewed the triage vital signs and the nursing notes.   HISTORY  Chief Complaint Sore Throat   HPI Colin Glass is a 29 y.o. male is here today with complaints with throat and body aches since yesterday. Patient states he has not taken any over-the-counter medications prior to his arrival. He is unaware of any fever. He denies any cough, nausea, vomiting or diarrhea. Currently he rates his throat pain is 8 out of 10.   History reviewed. No pertinent past medical history.  There are no active problems to display for this patient.   History reviewed. No pertinent past surgical history.  Current Outpatient Rx  Name  Route  Sig  Dispense  Refill  . albuterol (PROVENTIL HFA;VENTOLIN HFA) 108 (90 BASE) MCG/ACT inhaler   Inhalation   Inhale 1-2 puffs into the lungs every 6 (six) hours as needed for wheezing or shortness of breath.         Marland Kitchen amoxicillin (AMOXIL) 500 MG capsule   Oral   Take 1 capsule (500 mg total) by mouth 3 (three) times daily.   30 capsule   0   . cyclobenzaprine (FLEXERIL) 10 MG tablet   Oral   Take 1 tablet (10 mg total) by mouth every 8 (eight) hours as needed for muscle spasms.   30 tablet   1   . HYDROcodone-acetaminophen (NORCO) 5-325 MG tablet   Oral   Take 1-2 tablets by mouth every 4 (four) hours as needed for moderate pain.   15 tablet   0   . ibuprofen (ADVIL,MOTRIN) 800 MG tablet   Oral   Take 1 tablet (800 mg total) by mouth every 8 (eight) hours as needed.   30 tablet   0   . lansoprazole (PREVACID) 30 MG capsule   Oral   Take 1 capsule (30 mg total) by mouth daily at 12 noon.   30 capsule   0   . ranitidine (ZANTAC) 75 MG tablet   Oral   Take 75 mg by mouth as needed for heartburn.           Allergies Review of patient's allergies indicates no known  allergies.  No family history on file.  Social History Social History  Substance Use Topics  . Smoking status: Current Every Day Smoker -- 0.50 packs/day    Types: Cigarettes    Last Attempt to Quit: 10/21/2014  . Smokeless tobacco: None  . Alcohol Use: Yes    Review of Systems Constitutional: No fever/chills Eyes: No visual changes. ENT: Positive sore throat. Cardiovascular: Denies chest pain. Respiratory: Denies shortness of breath. Gastrointestinal: No abdominal pain.  No nausea, no vomiting.   Genitourinary: Negative for dysuria. Musculoskeletal: Negative for back pain. Skin: Negative for rash. Neurological: Negative for headaches, focal weakness or numbness.  [name] ROS otherwise negative.  ____________________________________________   PHYSICAL EXAM:  VITAL SIGNS: ED Triage Vitals  Enc Vitals Group     BP 04/11/15 0839 137/83 mmHg     Pulse Rate 04/11/15 0839 92     Resp 04/11/15 0839 18     Temp 04/11/15 0839 98.2 F (36.8 C)     Temp Source 04/11/15 0839 Oral     SpO2 04/11/15 0839 94 %     Weight 04/11/15 0839 185 lb (83.915 kg)     Height 04/11/15 0839 6\' 2"  (1.88 m)  Head Cir --      Peak Flow --      Pain Score 04/11/15 0841 8     Pain Loc --      Pain Edu? --      Excl. in [place]? --     Constitutional: Alert and oriented. Well appearing and in no acute distress. Eyes: Conjunctivae are normal. PERRL. EOMI. Head: Atraumatic. Nose: No congestion/rhinnorhea. Mouth/Throat: Mucous membranes are moist.  Oropharynx erythematous with white exudate present bilaterally.. Neck: No stridor.  Supple Hematological/Lymphatic/Immunilogical: Mild bilateral cervical lymphadenopathy. Cardiovascular: Normal rate, regular rhythm. Grossly normal heart sounds.  Good peripheral circulation. Respiratory: Normal respiratory effort.  No retractions. Lungs CTAB. Gastrointestinal: Soft and nontender. No distention. Musculoskeletal: No lower extremity tenderness nor  edema.  No joint effusions. Neurologic:  Normal speech and language. No gross focal neurologic deficits are appreciated. No gait instability. Skin:  Skin is warm, dry and intact. No rash noted. Psychiatric: Mood and affect are normal. Speech and behavior are normal.  ____________________________________________   LABS (all labs ordered are listed, but only abnormal results are displayed)  Labs Reviewed - No data to display  PROCEDURES  Procedure(s) performed: None  Critical Care performed: No  ____________________________________________   INITIAL IMPRESSION / ASSESSMENT AND PLAN / ED COURSE  Pertinent labs & imaging results that were available during my care of the patient were reviewed by me and considered in my medical decision making (see chart for details).  Strep test was reported positive. Patient was placed on amoxicillin 500 mg 3 times a day for 10 days. He is use Tylenol and ibuprofen as needed for throat pain and follow-up with [facility] if any continued problems. ____________________________________________   FINAL CLINICAL IMPRESSION(S) / ED DIAGNOSES  Final diagnoses:  Strep throat      Johnn Hai, PA-C 04/11/15 1010  Johnn Hai, [place] 04/11/15 1011  Earleen Newport, MD 04/11/15 [id number]

## 2015-04-11 NOTE — Discharge Instructions (Signed)
Pharyngitis Pharyngitis is a sore throat (pharynx). There is redness, pain, and swelling of your throat. HOME CARE   Drink enough fluids to keep your pee (urine) clear or pale yellow.  Only take medicine as told by your doctor.  You may get sick again if you do not take medicine as told. Finish your medicines, even if you start to feel better.  Do not take aspirin.  Rest.  Rinse your mouth (gargle) with salt water ( tsp of salt per 1 qt of water) every 1-2 hours. This will help the pain.  If you are not at risk for choking, you can suck on hard candy or sore throat lozenges. GET HELP IF:  You have large, tender lumps on your neck.  You have a rash.  You cough up green, yellow-brown, or bloody spit. GET HELP RIGHT AWAY IF:   You have a stiff neck.  You drool or cannot swallow liquids.  You throw up (vomit) or are not able to keep medicine or liquids down.  You have very bad pain that does not go away with medicine.  You have problems breathing (not from a stuffy nose). MAKE SURE YOU:   Understand these instructions.  Will watch your condition.  Will get help right away if you are not doing well or get worse.   This information is not intended to replace advice given to you by your health care provider. Make sure you discuss any questions you have with your health care provider.   Document Released: 10/10/2007 Document Revised: 02/11/2013 Document Reviewed: 12/29/2012 Elsevier Interactive Patient Education 2016 [place] all medication until completely finished. Tylenol or ibuprofen as needed for throat pain. Increase fluids. Follow-up with [facility] if any continued problems.

## 2015-04-11 NOTE — ED Notes (Signed)
Awoke yesterday with sore throat and body aches

## 2015-04-11 NOTE — ED Notes (Signed)
Assessment per PA 

## 2015-04-12 LAB — POCT RAPID STREP A: Streptococcus, Group A Screen (Direct): POSITIVE — AB

## 2015-04-15 LAB — CULTURE, GROUP A STREP (THRC)

## 2015-04-30 ENCOUNTER — Encounter: Payer: Self-pay | Admitting: Emergency Medicine

## 2015-04-30 ENCOUNTER — Emergency Department
Admission: EM | Admit: 2015-04-30 | Discharge: 2015-04-30 | Disposition: A | Discharge status: Home / Self Care | Payer: Self-pay | Attending: Emergency Medicine | Admitting: Emergency Medicine

## 2015-04-30 ENCOUNTER — Emergency Department: Payer: Self-pay

## 2015-04-30 DIAGNOSIS — Z23 Encounter for immunization: Secondary | ICD-10-CM | POA: Insufficient documentation

## 2015-04-30 DIAGNOSIS — F17210 Nicotine dependence, cigarettes, uncomplicated: Secondary | ICD-10-CM | POA: Insufficient documentation

## 2015-04-30 DIAGNOSIS — Y9289 Other specified places as the place of occurrence of the external cause: Secondary | ICD-10-CM | POA: Insufficient documentation

## 2015-04-30 DIAGNOSIS — S01511A Laceration without foreign body of lip, initial encounter: Secondary | ICD-10-CM | POA: Insufficient documentation

## 2015-04-30 DIAGNOSIS — S0990XA Unspecified injury of head, initial encounter: Secondary | ICD-10-CM | POA: Insufficient documentation

## 2015-04-30 DIAGNOSIS — Y9389 Activity, other specified: Secondary | ICD-10-CM | POA: Insufficient documentation

## 2015-04-30 DIAGNOSIS — Z79899 Other long term (current) drug therapy: Secondary | ICD-10-CM | POA: Insufficient documentation

## 2015-04-30 DIAGNOSIS — S0240DA Maxillary fracture, left side, initial encounter for closed fracture: Secondary | ICD-10-CM | POA: Insufficient documentation

## 2015-04-30 DIAGNOSIS — Y998 Other external cause status: Secondary | ICD-10-CM | POA: Insufficient documentation

## 2015-04-30 MED ORDER — CHLORHEXIDINE GLUCONATE 0.12 % MT SOLN: 15.0000 mL | Freq: Two times a day (BID) | OROMUCOSAL | Status: DC

## 2015-04-30 MED ORDER — OXYCODONE-ACETAMINOPHEN 5-325 MG PO TABS: 1.0000 | ORAL_TABLET | ORAL | Status: DC | PRN

## 2015-04-30 MED ORDER — OXYCODONE-ACETAMINOPHEN 5-325 MG PO TABS
ORAL_TABLET | ORAL | Status: AC
  Administered 2015-04-30: 2 via ORAL
  Filled 2015-04-30: qty 2

## 2015-04-30 MED ORDER — TETANUS-DIPHTH-ACELL PERTUSSIS 5-2.5-18.5 LF-MCG/0.5 IM SUSP
0.5000 mL | Freq: Once | INTRAMUSCULAR | Status: AC
  Administered 2015-04-30: 0.5 mL via INTRAMUSCULAR

## 2015-04-30 MED ORDER — TETANUS-DIPHTH-ACELL PERTUSSIS 5-2.5-18.5 LF-MCG/0.5 IM SUSP
INTRAMUSCULAR | Status: AC
  Administered 2015-04-30: 0.5 mL via INTRAMUSCULAR
  Filled 2015-04-30: qty 0.5

## 2015-04-30 MED ORDER — OXYCODONE-ACETAMINOPHEN 5-325 MG PO TABS
2.0000 | ORAL_TABLET | Freq: Once | ORAL | Status: AC
  Administered 2015-04-30: 2 via ORAL
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
## 2015-07-22 ENCOUNTER — Emergency Department
Admission: EM | Admit: 2015-07-22 | Discharge: 2015-07-22 | Disposition: A | Discharge status: Home / Self Care | Payer: No Typology Code available for payment source | Attending: Emergency Medicine | Admitting: Emergency Medicine

## 2015-07-22 ENCOUNTER — Emergency Department: Payer: No Typology Code available for payment source

## 2015-07-22 ENCOUNTER — Encounter: Payer: Self-pay | Admitting: Emergency Medicine

## 2015-07-22 DIAGNOSIS — R079 Chest pain, unspecified: Secondary | ICD-10-CM | POA: Insufficient documentation

## 2015-07-22 DIAGNOSIS — R0989 Other specified symptoms and signs involving the circulatory and respiratory systems: Secondary | ICD-10-CM | POA: Insufficient documentation

## 2015-07-22 DIAGNOSIS — F17210 Nicotine dependence, cigarettes, uncomplicated: Secondary | ICD-10-CM | POA: Insufficient documentation

## 2015-07-22 LAB — CBC
HCT: 44.4 % (ref 40.0–52.0)
Hemoglobin: 15 g/dL (ref 13.0–18.0)
MCH: 28.8 pg (ref 26.0–34.0)
MCHC: 33.7 g/dL (ref 32.0–36.0)
MCV: 85.2 fL (ref 80.0–100.0)
PLATELETS: 313 10*3/uL (ref 150–440)
RBC: 5.21 MIL/uL (ref 4.40–5.90)
RDW: 14.6 % — AB (ref 11.5–14.5)
WBC: 8.6 10*3/uL (ref 3.8–10.6)

## 2015-07-22 LAB — BASIC METABOLIC PANEL
Anion gap: 7 (ref 5–15)
BUN: 9 mg/dL (ref 6–20)
CALCIUM: 8.7 mg/dL — AB (ref 8.9–10.3)
CO2: 27 mmol/L (ref 22–32)
CREATININE: 1.22 mg/dL (ref 0.61–1.24)
Chloride: 103 mmol/L (ref 101–111)
GFR calc Af Amer: >60 mL/min (ref >60)
Glucose, Bld: 97 mg/dL (ref 65–99)
Potassium: 3.8 mmol/L (ref 3.5–5.1)
SODIUM: 137 mmol/L (ref 135–145)

## 2015-07-22 LAB — TROPONIN I: Troponin I: <0.03 ng/mL (ref <0.031)

## 2015-07-22 NOTE — ED Notes (Signed)
Patient ambulatory to triage with steady gait, without difficulty or distress noted; pt reports reflux x year, taking OTC without relief; c/o persistent pain to chest/throat, sensation of "something stuck"; denies any accomp symptoms

## 2015-07-25 MED ORDER — ACETAMINOPHEN 500 MG PO TABS
ORAL_TABLET | ORAL | Status: AC
Start: 2015-07-25 — End: 2015-07-26
  Filled 2015-07-25: qty 2

## 2015-08-02 NOTE — ED Provider Notes (Signed)
[facility] Emergency Department Provider Note  ____________________________________________  Time seen: 3:00 AM  I have reviewed the triage vital signs and the nursing notes.   HISTORY  Chief Complaint Chest Pain      HPI Colin Glass is a 30 y.o. male presents with 7 out of 10 chest/throat pain. Patient states "feels like something stuck". Patient denies any dyspnea no nausea no vomiting no dizziness no palpitation. Patient states that he's had reflux for over a year and has been taking over-the-counter medication without any improvement.   Past medical history GERD  There are no active problems to display for this patient.   Past Surgical History  Procedure Laterality Date  . Mouth surgery      Current Outpatient Rx  Name  Route  Sig  Dispense  Refill  . albuterol (PROVENTIL HFA;VENTOLIN HFA) 108 (90 BASE) MCG/ACT inhaler   Inhalation   Inhale 1-2 puffs into the lungs every 6 (six) hours as needed for wheezing or shortness of breath.         . chlorhexidine (PERIDEX) 0.12 % solution   Mouth/Throat   Use as directed 15 mLs in the mouth or throat 2 (two) times daily.   120 mL   1   . cyclobenzaprine (FLEXERIL) 10 MG tablet   Oral   Take 1 tablet (10 mg total) by mouth every 8 (eight) hours as needed for muscle spasms.   30 tablet   1   . ibuprofen (ADVIL,MOTRIN) 800 MG tablet   Oral   Take 1 tablet (800 mg total) by mouth every 8 (eight) hours as needed.   30 tablet   0   . lansoprazole (PREVACID) 30 MG capsule   Oral   Take 1 capsule (30 mg total) by mouth daily at 12 noon.   30 capsule   0   . oxyCODONE-acetaminophen (ROXICET) 5-325 MG tablet   Oral   Take 1-2 tablets by mouth every 4 (four) hours as needed for severe pain.   30 tablet   0   . ranitidine (ZANTAC) 75 MG tablet   Oral   Take 75 mg by mouth as needed for heartburn.           Allergies No known drug allergies No family history on file.  Social  History Social History  Substance Use Topics  . Smoking status: Current Every Day Smoker -- 0.50 packs/day    Types: Cigarettes    Last Attempt to Quit: 10/21/2014  . Smokeless tobacco: None  . Alcohol Use: Yes    Review of Systems  Constitutional: Negative for fever. Eyes: Negative for visual changes. ENT: Negative for sore throat. Cardiovascular: Positive for chest pain. Respiratory: Negative for shortness of breath. Gastrointestinal: Negative for abdominal pain, vomiting and diarrhea. Genitourinary: Negative for dysuria. Musculoskeletal: Negative for back pain. Skin: Negative for rash. Neurological: Negative for headaches, focal weakness or numbness.   10-point ROS otherwise negative.  ____________________________________________   PHYSICAL EXAM:  VITAL SIGNS: ED Triage Vitals  Enc Vitals Group     BP 07/22/15 0059 131/73 mmHg     Pulse Rate 07/22/15 0059 93     Resp 07/22/15 0059 20     Temp 07/22/15 0059 97.7 F (36.5 C)     Temp Source 07/22/15 0059 Oral     SpO2 07/22/15 0059 98 %     Weight 07/22/15 0059 175 lb (79.379 kg)     Height 07/22/15 0059 6\' 2"  (1.88 m)  Head Cir --      Peak Flow --      Pain Score 07/22/15 0059 7     Pain Loc --      Pain Edu? --      Excl. in [place]? --      Constitutional: Alert and oriented. Well appearing and in no distress. Eyes: Conjunctivae are normal. PERRL. Normal extraocular movements. ENT   Head: Normocephalic and atraumatic.   Nose: No congestion/rhinnorhea.   Mouth/Throat: Mucous membranes are moist.   Neck: No stridor. Hematological/Lymphatic/Immunilogical: No cervical lymphadenopathy. Cardiovascular: Normal rate, regular rhythm. Normal and symmetric distal pulses are present in all extremities. No murmurs, rubs, or gallops. Respiratory: Normal respiratory effort without tachypnea nor retractions. Breath sounds are clear and equal bilaterally. No wheezes/rales/rhonchi. Gastrointestinal: Soft and  nontender. No distention. There is no CVA tenderness. Genitourinary: deferred Musculoskeletal: Nontender with normal range of motion in all extremities. No joint effusions.  No lower extremity tenderness nor edema. Neurologic:  Normal speech and language. No gross focal neurologic deficits are appreciated. Speech is normal.  Skin:  Skin is warm, dry and intact. No rash noted. Psychiatric: Mood and affect are normal. Speech and behavior are normal. Patient exhibits appropriate insight and judgment.  ____________________________________________    LABS (pertinent positives/negatives)  Labs Reviewed  BASIC METABOLIC PANEL - Abnormal; Notable for the following:    Calcium 8.7 (*)    All other components within normal limits  CBC - Abnormal; Notable for the following:    RDW 14.6 (*)    All other components within normal limits  TROPONIN I     ____________________________________________   EKG  ED ECG REPORT I, [place] N BROWN, the attending physician, personally viewed and interpreted this ECG.   Date: 08/02/2015  EKG Time: 1:04 AM  Rate: 89  Rhythm: Normal sinus rhythm  Axis: Normal  Intervals: Normal  ST&T Change: None  ____________________________________________    RADIOLOGY  DG Chest 2 View (Final result) Result time: 07/22/15 01:34:51   Final result by Rad Results In Interface (07/22/15 01:34:51)   Narrative:   CLINICAL DATA: Chest pain  EXAM: CHEST 2 VIEW  COMPARISON: 11/04/2014  FINDINGS: Normal heart size and mediastinal contours. No acute infiltrate or edema. No effusion or pneumothorax. No acute osseous findings.  IMPRESSION: Negative chest.   Electronically Signed By: Monte Fantasia M.D. On: 07/22/2015 01:34      ____________________________________________   INITIAL IMPRESSION / ASSESSMENT AND PLAN / ED COURSE  Pertinent labs & imaging results that were available during my care of the patient were reviewed by me and  considered in my medical decision making (see chart for details).  History physical exam concern for possible gas esophageal reflux.  ____________________________________________   FINAL CLINICAL IMPRESSION(S) / ED DIAGNOSES  Final diagnoses:  None  Gastroesophageal reflux disease    Gregor Hams, MD 08/02/15 [phone]

## 2015-09-24 ENCOUNTER — Emergency Department: Payer: No Typology Code available for payment source

## 2015-09-24 ENCOUNTER — Emergency Department
Admission: EM | Admit: 2015-09-24 | Discharge: 2015-09-24 | Disposition: A | Discharge status: Home / Self Care | Payer: No Typology Code available for payment source | Attending: Emergency Medicine | Admitting: Emergency Medicine

## 2015-09-24 ENCOUNTER — Encounter: Payer: Self-pay | Admitting: Emergency Medicine

## 2015-09-24 DIAGNOSIS — Y999 Unspecified external cause status: Secondary | ICD-10-CM | POA: Insufficient documentation

## 2015-09-24 DIAGNOSIS — S161XXA Strain of muscle, fascia and tendon at neck level, initial encounter: Secondary | ICD-10-CM | POA: Diagnosis not present

## 2015-09-24 DIAGNOSIS — M542 Cervicalgia: Secondary | ICD-10-CM | POA: Diagnosis present

## 2015-09-24 DIAGNOSIS — F17210 Nicotine dependence, cigarettes, uncomplicated: Secondary | ICD-10-CM | POA: Diagnosis not present

## 2015-09-24 DIAGNOSIS — Y92410 Unspecified street and highway as the place of occurrence of the external cause: Secondary | ICD-10-CM | POA: Insufficient documentation

## 2015-09-24 DIAGNOSIS — S0093XA Contusion of unspecified part of head, initial encounter: Secondary | ICD-10-CM | POA: Diagnosis not present

## 2015-09-24 DIAGNOSIS — M791 Myalgia: Secondary | ICD-10-CM | POA: Diagnosis not present

## 2015-09-24 DIAGNOSIS — Y939 Activity, unspecified: Secondary | ICD-10-CM | POA: Insufficient documentation

## 2015-09-24 DIAGNOSIS — B330 Epidemic myalgia: Secondary | ICD-10-CM

## 2015-09-24 DIAGNOSIS — R51 Headache: Secondary | ICD-10-CM | POA: Insufficient documentation

## 2015-09-24 MED ORDER — BACLOFEN 10 MG PO TABS: 10.0000 mg | ORAL_TABLET | Freq: Three times a day (TID) | ORAL | Status: DC

## 2015-09-24 MED ORDER — RANITIDINE HCL 150 MG PO TABS: 150.0000 mg | ORAL_TABLET | Freq: Two times a day (BID) | ORAL | Status: DC

## 2015-09-24 MED ORDER — DIAZEPAM 5 MG PO TABS
5.0000 mg | ORAL_TABLET | Freq: Once | ORAL | Status: AC
  Administered 2015-09-24: 5 mg via ORAL
  Filled 2015-09-24: qty 1

## 2015-09-24 MED ORDER — NAPROXEN 500 MG PO TABS: 500.0000 mg | ORAL_TABLET | Freq: Two times a day (BID) | ORAL | Status: DC

## 2015-09-24 MED ORDER — KETOROLAC TROMETHAMINE 60 MG/2ML IM SOLN
60.0000 mg | Freq: Once | INTRAMUSCULAR | Status: AC
  Administered 2015-09-24: 60 mg via INTRAMUSCULAR
  Filled 2015-09-24: qty 2
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
## 2015-11-10 ENCOUNTER — Emergency Department: Payer: Self-pay

## 2015-11-10 ENCOUNTER — Emergency Department
Admission: EM | Admit: 2015-11-10 | Discharge: 2015-11-10 | Disposition: A | Discharge status: Home / Self Care | Payer: Self-pay | Attending: Emergency Medicine | Admitting: Emergency Medicine

## 2015-11-10 DIAGNOSIS — Z202 Contact with and (suspected) exposure to infections with a predominantly sexual mode of transmission: Secondary | ICD-10-CM | POA: Insufficient documentation

## 2015-11-10 DIAGNOSIS — K2970 Gastritis, unspecified, without bleeding: Secondary | ICD-10-CM | POA: Insufficient documentation

## 2015-11-10 DIAGNOSIS — F17210 Nicotine dependence, cigarettes, uncomplicated: Secondary | ICD-10-CM | POA: Insufficient documentation

## 2015-11-10 DIAGNOSIS — R1013 Epigastric pain: Secondary | ICD-10-CM

## 2015-11-10 LAB — COMPREHENSIVE METABOLIC PANEL
ALK PHOS: 64 U/L (ref 38–126)
ALT: 25 U/L (ref 17–63)
ANION GAP: 6 (ref 5–15)
AST: 23 U/L (ref 15–41)
Albumin: 4.7 g/dL (ref 3.5–5.0)
BUN: 13 mg/dL (ref 6–20)
CALCIUM: 9.2 mg/dL (ref 8.9–10.3)
CO2: 27 mmol/L (ref 22–32)
CREATININE: 1.09 mg/dL (ref 0.61–1.24)
Chloride: 103 mmol/L (ref 101–111)
Glucose, Bld: 94 mg/dL (ref 65–99)
Potassium: 3.9 mmol/L (ref 3.5–5.1)
SODIUM: 136 mmol/L (ref 135–145)
TOTAL PROTEIN: 7.3 g/dL (ref 6.5–8.1)
Total Bilirubin: 2.6 mg/dL — ABNORMAL HIGH (ref 0.3–1.2)

## 2015-11-10 LAB — CBC
HCT: 46.3 % (ref 40.0–52.0)
HEMOGLOBIN: 15.8 g/dL (ref 13.0–18.0)
MCH: 28.5 pg (ref 26.0–34.0)
MCHC: 34.1 g/dL (ref 32.0–36.0)
MCV: 83.4 fL (ref 80.0–100.0)
Platelets: 204 10*3/uL (ref 150–440)
RBC: 5.55 MIL/uL (ref 4.40–5.90)
RDW: 14.6 % — AB (ref 11.5–14.5)
WBC: 5.4 10*3/uL (ref 3.8–10.6)

## 2015-11-10 LAB — URINALYSIS COMPLETE WITH MICROSCOPIC (ARMC ONLY)
BACTERIA UA: NONE SEEN
Bilirubin Urine: NEGATIVE
GLUCOSE, UA: NEGATIVE mg/dL
HGB URINE DIPSTICK: NEGATIVE
Ketones, ur: NEGATIVE mg/dL
Leukocytes, UA: NEGATIVE
Nitrite: NEGATIVE
PROTEIN: NEGATIVE mg/dL
Specific Gravity, Urine: 1.025 (ref 1.005–1.030)
pH: 6 (ref 5.0–8.0)

## 2015-11-10 LAB — LIPASE, BLOOD: Lipase: 41 U/L (ref 11–51)

## 2015-11-10 MED ORDER — METRONIDAZOLE 500 MG PO TABS
500.0000 mg | ORAL_TABLET | Freq: Once | ORAL | Status: AC
  Administered 2015-11-10: 500 mg via ORAL

## 2015-11-10 MED ORDER — GI COCKTAIL ~~LOC~~
ORAL | Status: AC
  Filled 2015-11-10: qty 30

## 2015-11-10 MED ORDER — OMEPRAZOLE 40 MG PO CPDR: 40.0000 mg | DELAYED_RELEASE_CAPSULE | Freq: Every day | ORAL | Status: DC

## 2015-11-10 MED ORDER — SUCRALFATE 1 G PO TABS: 1.0000 g | ORAL_TABLET | Freq: Four times a day (QID) | ORAL | Status: DC

## 2015-11-10 MED ORDER — AZITHROMYCIN 1 G PO PACK
PACK | ORAL | Status: AC
  Administered 2015-11-10: 1 g via ORAL
  Filled 2015-11-10: qty 1

## 2015-11-10 MED ORDER — CEFTRIAXONE SODIUM 250 MG IJ SOLR
INTRAMUSCULAR | Status: AC
  Administered 2015-11-10: 250 mg via INTRAMUSCULAR
  Filled 2015-11-10: qty 250

## 2015-11-10 MED ORDER — METRONIDAZOLE 500 MG PO TABS: 500.0000 mg | ORAL_TABLET | Freq: Two times a day (BID) | ORAL | Status: AC

## 2015-11-10 MED ORDER — GI COCKTAIL ~~LOC~~
30.0000 mL | Freq: Once | ORAL | Status: AC
  Administered 2015-11-10: 30 mL via ORAL

## 2015-11-10 MED ORDER — AZITHROMYCIN 1 G PO PACK
1.0000 g | PACK | Freq: Once | ORAL | Status: AC
  Administered 2015-11-10: 1 g via ORAL

## 2015-11-10 MED ORDER — METRONIDAZOLE 500 MG PO TABS
ORAL_TABLET | ORAL | Status: AC
  Administered 2015-11-10: 500 mg via ORAL
  Filled 2015-11-10: qty 1

## 2015-11-10 MED ORDER — CEFTRIAXONE SODIUM 250 MG IJ SOLR
250.0000 mg | Freq: Once | INTRAMUSCULAR | Status: AC
  Administered 2015-11-10: 250 mg via INTRAMUSCULAR

## 2015-11-10 NOTE — ED Provider Notes (Addendum)
[facility] Emergency Department Provider Note   ____________________________________________  Time seen: Approximately 315 PM  I have reviewed the triage vital signs and the nursing notes.   HISTORY  Chief Complaint Abdominal Pain   HPI Colin Glass is a 30 y.o. male with a history of gastritis who is presenting to the emergency department today with burning upper abdominal pain which goes into his chest. He says that this pain has been present since this past Monday. He says that he has vomited once and has had slightly runny stools. Denies any blood or black stools. Denies any blood in his vomit. He says that he drank a lot this past weekend because of her relative who is visiting. Says that he drinks every 2-3 days and says he "drinks still there is no more to drink." Says that he has also had a family history of alcoholism. Denies any history of gallbladder issues. Says the pain is moderate and does not radiate to the back.Patient says that he has tried multiple PPIs at home as well as ranitidine without any relief.   History reviewed. No pertinent past medical history.  There are no active problems to display for this patient.   Past Surgical History  Procedure Laterality Date  . Mouth surgery    . Dental surgery      Current Outpatient Rx  Name  Route  Sig  Dispense  Refill  . albuterol (PROVENTIL HFA;VENTOLIN HFA) 108 (90 BASE) MCG/ACT inhaler   Inhalation   Inhale 1-2 puffs into the lungs every 6 (six) hours as needed for wheezing or shortness of breath.         . baclofen (LIORESAL) 10 MG tablet   Oral   Take 1 tablet (10 mg total) by mouth 3 (three) times daily.   30 tablet   0   . naproxen (NAPROSYN) 500 MG tablet   Oral   Take 1 tablet (500 mg total) by mouth 2 (two) times daily with a meal.   60 tablet   0   . ranitidine (ZANTAC) 150 MG tablet   Oral   Take 1 tablet (150 mg total) by mouth 2 (two) times daily.   30  tablet   1     Allergies Review of patient's allergies indicates no known allergies.  No family history on file.  Social History Social History  Substance Use Topics  . Smoking status: Current Every Day Smoker -- 0.50 packs/day    Types: Cigarettes    Last Attempt to Quit: 10/21/2014  . Smokeless tobacco: None  . Alcohol Use: Yes    Review of Systems Constitutional: No fever/chills Eyes: No visual changes. ENT: No sore throat. Cardiovascular: Denies chest pain. Respiratory: Denies shortness of breath. Gastrointestinal: No constipation. Genitourinary: Negative for dysuria. Musculoskeletal: Negative for back pain. Skin: Negative for rash. Neurological: Negative for headaches, focal weakness or numbness.  10-point ROS otherwise negative.  ____________________________________________   PHYSICAL EXAM:  VITAL SIGNS: ED Triage Vitals  Enc Vitals Group     BP 11/10/15 1314 119/67 mmHg     Pulse Rate 11/10/15 1314 73     Resp 11/10/15 1314 18     Temp 11/10/15 1314 98.4 F (36.9 C)     Temp Source 11/10/15 1314 Oral     SpO2 11/10/15 1314 98 %     Weight 11/10/15 1314 180 lb (81.647 kg)     Height 11/10/15 1314 6\' 2"  (1.88 m)  Head Cir --      Peak Flow --      Pain Score 11/10/15 1322 7     Pain Loc --      Pain Edu? --      Excl. in [place]? --     Constitutional: Alert and oriented. Well appearing and in no acute distress. Eyes: Conjunctivae are normal. PERRL. EOMI. Head: Atraumatic. Nose: No congestion/rhinnorhea. Mouth/Throat: Mucous membranes are moist.   Neck: No stridor.   Cardiovascular: Normal rate, regular rhythm. Grossly normal heart sounds.  Good peripheral circulation. Respiratory: Normal respiratory effort.  No retractions. Lungs CTAB. Gastrointestinal: Soft with mild tenderness to the upper abdomen. There is a negative Murphy sign. No distention.  No CVA tenderness. Musculoskeletal: No lower extremity tenderness nor edema.  No joint  effusions. Neurologic:  Normal speech and language. No gross focal neurologic deficits are appreciated.  Skin:  Skin is warm, dry and intact. No rash noted. Psychiatric: Mood and affect are normal. Speech and behavior are normal.  ____________________________________________   LABS (all labs ordered are listed, but only abnormal results are displayed)  Labs Reviewed  COMPREHENSIVE METABOLIC PANEL - Abnormal; Notable for the following:    Total Bilirubin 2.6 (*)    All other components within normal limits  CBC - Abnormal; Notable for the following:    RDW 14.6 (*)    All other components within normal limits  URINALYSIS COMPLETEWITH MICROSCOPIC ([facility] ONLY) - Abnormal; Notable for the following:    Color, Urine YELLOW (*)    APPearance CLEAR (*)    Squamous Epithelial / LPF 0-5 (*)    All other components within normal limits  LIPASE, BLOOD   ____________________________________________  EKG   ____________________________________________  RADIOLOGY   US Abdomen Limited RUQ (Final result) Result time: 11/10/15 16:05:35   Final result by Rad Results In Interface (11/10/15 16:05:35)   Narrative:   CLINICAL DATA: Epigastric abdominal pain for the past 4 days  EXAM: US ABDOMEN LIMITED - RIGHT UPPER QUADRANT  COMPARISON: Abdominal ultrasound of January 18, 2013  FINDINGS: Gallbladder:  The gallbladder is adequately distended. The gallbladder wall demonstrates borderline thickening at 3.1 mm. There is no positive sonographic Murphy's sign. There are no gallstones and no pericholecystic fluid.  Common bile duct:  Diameter: 4.4 mm. No common duct stones are observed.  Liver:  The hepatic echotexture is mildly increased. There is no focal mass or ductal dilation. The surface contour of the liver is normal.  IMPRESSION: 1. No gallstones or sonographic evidence of acute cholecystitis. Borderline gallbladder wall thickening. If there are clinical concerns of  chronic cholecystitis, a nuclear medicine hepatobiliary scan may be useful. 2. Fatty infiltrative change of the liver.   Electronically Signed By: Lajuan Kovaleski [place] M.D. On: 11/10/2015 16:05       ____________________________________________   PROCEDURES   Procedures  ____________________________________________   INITIAL IMPRESSION / ASSESSMENT AND PLAN / ED COURSE  Pertinent labs & imaging results that were available during my care of the patient were reviewed by me and considered in my medical decision making (see chart for details).  ----------------------------------------- 4:51 PM on 11/10/2015 -----------------------------------------  Patient's pain is relieved with GI cocktail. No acute findings on the ultrasound. I discussed the findings with him in regards to his fatty liver as well as borderline thickened gallbladder wall. He understands and follow with primary care. Furthermore, the mother of his children is here was diagnosed with Trichomonas. Therefore, he will also be treated for STDs. He denies  any discharge or GU symptoms. At this point I did do a GU exam did not reveal any pathology. There are no lesions. No pain or discharge. No testicular pain or swelling. He understands that he must not have any sexual intercourse until he is further cleared after testing for other STDs including HIV, syphilis and herpes. He will be following up with the [facility] clinic. We also discussed reducing his alcohol intake. He understands that some of the findings today may be related to this including his gastritis as well as fatty liver. ____________________________________________   FINAL CLINICAL IMPRESSION(S) / ED DIAGNOSES  Final diagnoses:  Epigastric abdominal pain  Gastritis. STD exposure.   NEW MEDICATIONS STARTED DURING THIS VISIT:  New Prescriptions   No medications on file     Note:  This document was prepared using Dragon voice recognition software and may  include unintentional dictation errors.    Orbie Pyo, MD 11/10/15 [phone]  ED ECG REPORT I, Doran Stabler, the attending physician, personally viewed and interpreted this ECG.   Date: 11/10/2015  EKG Time: 1324  Rate: 68  Rhythm: normal sinus rhythm  Axis: Normal axis  Intervals:none  ST&T Change: ST elevations in leads 1, 2 and aVL as well as the precordial leads which is also seen on previous EKGs. Consistent with benign early repolarization.   Orbie Pyo, MD 11/10/15 [phone]

## 2015-11-10 NOTE — ED Notes (Signed)
Pt c/o epigastric pain with nausea for the past 3 days, states he was Rx ranitidine last time here but that did not relieve the pain.

## 2016-03-05 ENCOUNTER — Encounter: Payer: Self-pay | Admitting: Emergency Medicine

## 2016-03-05 ENCOUNTER — Emergency Department
Admission: EM | Admit: 2016-03-05 | Discharge: 2016-03-05 | Discharge status: Against Medical Advice | Payer: No Typology Code available for payment source | Attending: Emergency Medicine | Admitting: Emergency Medicine

## 2016-03-05 ENCOUNTER — Emergency Department: Payer: No Typology Code available for payment source

## 2016-03-05 DIAGNOSIS — Y929 Unspecified place or not applicable: Secondary | ICD-10-CM | POA: Insufficient documentation

## 2016-03-05 DIAGNOSIS — Y999 Unspecified external cause status: Secondary | ICD-10-CM | POA: Insufficient documentation

## 2016-03-05 DIAGNOSIS — F17210 Nicotine dependence, cigarettes, uncomplicated: Secondary | ICD-10-CM | POA: Insufficient documentation

## 2016-03-05 DIAGNOSIS — Z791 Long term (current) use of non-steroidal anti-inflammatories (NSAID): Secondary | ICD-10-CM | POA: Insufficient documentation

## 2016-03-05 DIAGNOSIS — S39012A Strain of muscle, fascia and tendon of lower back, initial encounter: Secondary | ICD-10-CM | POA: Diagnosis not present

## 2016-03-05 DIAGNOSIS — Y939 Activity, unspecified: Secondary | ICD-10-CM | POA: Insufficient documentation

## 2016-03-05 DIAGNOSIS — M545 Low back pain: Secondary | ICD-10-CM | POA: Diagnosis present

## 2016-03-05 DIAGNOSIS — X58XXXA Exposure to other specified factors, initial encounter: Secondary | ICD-10-CM | POA: Diagnosis not present

## 2016-03-05 LAB — URINALYSIS COMPLETE WITH MICROSCOPIC (ARMC ONLY)
BILIRUBIN URINE: NEGATIVE
Bacteria, UA: NONE SEEN
GLUCOSE, UA: NEGATIVE mg/dL
HGB URINE DIPSTICK: NEGATIVE
Ketones, ur: NEGATIVE mg/dL
LEUKOCYTES UA: NEGATIVE
Nitrite: NEGATIVE
Protein, ur: NEGATIVE mg/dL
RBC / HPF: NONE SEEN RBC/hpf (ref 0–5)
Specific Gravity, Urine: 1.02 (ref 1.005–1.030)
pH: 7 (ref 5.0–8.0)

## 2016-03-05 NOTE — ED Notes (Signed)
When pt returned from x-ray  He left room with family

## 2016-03-05 NOTE — ED Triage Notes (Signed)
Pt reports lower back pain for the past 2 weeks. Reports working 2 jobs and being on his feet a lot but denies any injury. Pt denies any urinary symptoms at this time.

## 2016-03-05 NOTE — ED Provider Notes (Signed)
[facility] Emergency Department Provider Note   ____________________________________________   First MD Initiated Contact with Patient 03/05/16 1022     (approximate)  I have reviewed the triage vital signs and the nursing notes.   HISTORY  Chief Complaint Back Pain  HPI Colin Glass is a 30 y.o. male patient complaining of low back pain for 2 weeks. Patient denies any radicular component to this pain. Patient denies any bladder or bowel dysfunction. Patient also states that the pain radiates to the left right side. Patient stated mild transient relief with over-the-counter ibuprofen.Patient rates pain as 8/10. Patient described a pain as "achy". No other palliative measures for this complaint.   History reviewed. No pertinent past medical history.  There are no active problems to display for this patient.   Past Surgical History:  Procedure Laterality Date  . DENTAL SURGERY    . MOUTH SURGERY      Prior to Admission medications   Medication Sig Start Date End Date Taking? Authorizing Provider  albuterol (PROVENTIL HFA;VENTOLIN HFA) 108 (90 BASE) MCG/ACT inhaler Inhale 1-2 puffs into the lungs every 6 (six) hours as needed for wheezing or shortness of breath.    Historical Provider, MD  baclofen (LIORESAL) 10 MG tablet Take 1 tablet (10 mg total) by mouth 3 (three) times daily. 09/24/15   Arlyss Repress, PA-C  naproxen (NAPROSYN) 500 MG tablet Take 1 tablet (500 mg total) by mouth 2 (two) times daily with a meal. 09/24/15   Pierce Crane Beers, PA-C  omeprazole (PRILOSEC) 40 MG capsule Take 1 capsule (40 mg total) by mouth daily. 11/10/15 11/09/16  Orbie Pyo, MD  ranitidine (ZANTAC) 150 MG tablet Take 1 tablet (150 mg total) by mouth 2 (two) times daily. 09/24/15   Pierce Crane Beers, PA-C  sucralfate (CARAFATE) 1 g tablet Take 1 tablet (1 g total) by mouth 4 (four) times daily. 11/10/15 11/09/16  Orbie Pyo, MD    Allergies Review of  patient's allergies indicates no known allergies.  No family history on file.  Social History Social History  Substance Use Topics  . Smoking status: Current Every Day Smoker    Packs/day: 0.50    Types: Cigarettes    Last attempt to quit: 10/21/2014  . Smokeless tobacco: Not on file  . Alcohol use Yes    Review of Systems Constitutional: No fever/chills Eyes: No visual changes. ENT: No sore throat. Cardiovascular: Denies chest pain. Respiratory: Denies shortness of breath. Gastrointestinal: No abdominal pain.  No nausea, no vomiting.  No diarrhea.  No constipation. Genitourinary: Negative for dysuria. Musculoskeletal:Positive for back pain. Skin: Negative for rash. Neurological: Negative for headaches, focal weakness or numbness.    ____________________________________________   PHYSICAL EXAM:  VITAL SIGNS: ED Triage Vitals  Enc Vitals Group     BP 03/05/16 0925 124/70     Pulse Rate 03/05/16 0925 74     Resp 03/05/16 0925 16     Temp 03/05/16 0925 97.9 F (36.6 C)     Temp Source 03/05/16 0925 Oral     SpO2 03/05/16 0925 99 %     Weight 03/05/16 0926 180 lb (81.6 kg)     Height 03/05/16 0926 6\' 2"  (1.88 m)     Head Circumference --      Peak Flow --      Pain Score 03/05/16 0926 8     Pain Loc --      Pain Edu? --  Excl. in [place]? --     Constitutional: Alert and oriented. Well appearing and in no acute distress. Eyes: Conjunctivae are normal. PERRL. EOMI. Head: Atraumatic. Nose: No congestion/rhinnorhea. Mouth/Throat: Mucous membranes are moist.  Oropharynx non-erythematous. Neck: No stridor.  No cervical spine tenderness to palpation. Hematological/Lymphatic/Immunilogical: No cervical lymphadenopathy. Cardiovascular: Normal rate, regular rhythm. Grossly normal heart sounds.  Good peripheral circulation. Respiratory: Normal respiratory effort.  No retractions. Lungs CTAB. Gastrointestinal: Soft and nontender. No distention. No abdominal bruits. No CVA  tenderness. Musculoskeletal: Patient sits to stand her last upper extremity. No obvious spinal deformity noted. Patient has some moderate guarding palpation of 3 through S1. Patient has negative straight leg test. Patient has some paralumbar spinal muscle spasm bilaterally with lateral movements. No lower extremity tenderness nor edema.  No joint effusions. Neurologic:  Normal speech and language. No gross focal neurologic deficits are appreciated. No gait instability. Skin:  Skin is warm, dry and intact. No rash noted. Psychiatric: Mood and affect are normal. Speech and behavior are normal.  ____________________________________________   LABS (all labs ordered are listed, but only abnormal results are displayed)  Labs Reviewed  URINALYSIS COMPLETEWITH MICROSCOPIC ([place]) - Abnormal; Notable for the following:       Result Value   Color, Urine YELLOW (*)    APPearance CLOUDY (*)    Squamous Epithelial / LPF 0-5 (*)    All other components within normal limits   ____________________________________________  EKG   ____________________________________________  RADIOLOGY  No acute findings on x-ray of the lumbar spine. ____________________________________________   PROCEDURES  Procedure(s) performed: None  Procedures  Critical Care performed: No  ____________________________________________   INITIAL IMPRESSION / ASSESSMENT AND PLAN / ED COURSE  Pertinent labs & imaging results that were available during my care of the patient were reviewed by me and considered in my medical decision making (see chart for details).  Pain  Clinical Course   X-ray and urinalysis unremarkable. Patient left before receiving results.  ____________________________________________   FINAL CLINICAL IMPRESSION(S) / ED DIAGNOSES  Final diagnoses:  None   Patient eloped   NEW MEDICATIONS STARTED DURING THIS VISIT:  Discharge Medication List as of 03/05/2016 10:55 AM        Note:  This document was prepared using Dragon voice recognition software and may include unintentional dictation errors.    Sable Feil, PA-C 03/05/16 1139    Earleen Newport, MD 03/05/16 [phone]

## 2016-03-05 NOTE — ED Notes (Signed)
Lower back pain for about 2 weeks   Denies any specific injury  States pain radiates across lower back but not into legs

## 2016-03-18 ENCOUNTER — Encounter (HOSPITAL_COMMUNITY): Payer: Self-pay | Admitting: Emergency Medicine

## 2016-03-18 ENCOUNTER — Emergency Department (HOSPITAL_COMMUNITY)
Admission: EM | Admit: 2016-03-18 | Discharge: 2016-03-18 | Disposition: A | Discharge status: Home / Self Care | Payer: Self-pay | Attending: Emergency Medicine | Admitting: Emergency Medicine

## 2016-03-18 DIAGNOSIS — F17210 Nicotine dependence, cigarettes, uncomplicated: Secondary | ICD-10-CM | POA: Insufficient documentation

## 2016-03-18 DIAGNOSIS — Z5321 Procedure and treatment not carried out due to patient leaving prior to being seen by health care provider: Secondary | ICD-10-CM | POA: Insufficient documentation

## 2016-03-18 DIAGNOSIS — M549 Dorsalgia, unspecified: Secondary | ICD-10-CM | POA: Insufficient documentation

## 2016-03-18 DIAGNOSIS — R079 Chest pain, unspecified: Secondary | ICD-10-CM | POA: Insufficient documentation

## 2016-03-18 HISTORY — DX: Gastro-esophageal reflux disease without esophagitis: K21.9

## 2016-03-18 NOTE — ED Triage Notes (Signed)
Pt reports back pain x1 month and cp starting tonight, states right in the center, tried zantac with no relief. States similar to hx of gerd, but worse.

## 2016-03-18 NOTE — ED Notes (Signed)
Patient states he needs to go and get keys from the front.  States he doesn't not care about being seen.  Patient removed IV.

## 2016-08-10 ENCOUNTER — Emergency Department
Admission: EM | Admit: 2016-08-10 | Discharge: 2016-08-10 | Disposition: A | Discharge status: Home / Self Care | Payer: Self-pay | Attending: Student in an Organized Health Care Education/Training Program | Admitting: Student in an Organized Health Care Education/Training Program

## 2016-08-10 ENCOUNTER — Encounter: Payer: Self-pay | Admitting: Emergency Medicine

## 2016-08-10 DIAGNOSIS — M545 Low back pain, unspecified: Secondary | ICD-10-CM

## 2016-08-10 DIAGNOSIS — F17210 Nicotine dependence, cigarettes, uncomplicated: Secondary | ICD-10-CM | POA: Insufficient documentation

## 2016-08-10 DIAGNOSIS — G8929 Other chronic pain: Secondary | ICD-10-CM | POA: Insufficient documentation

## 2016-08-10 DIAGNOSIS — R12 Heartburn: Secondary | ICD-10-CM

## 2016-08-10 MED ORDER — CYCLOBENZAPRINE HCL 10 MG PO TABS: 10.0000 mg | ORAL_TABLET | Freq: Three times a day (TID) | ORAL | 0 refills | Status: DC | PRN

## 2016-08-10 MED ORDER — GI COCKTAIL ~~LOC~~
ORAL | Status: AC
  Filled 2016-08-10: qty 30

## 2016-08-10 MED ORDER — OMEPRAZOLE 40 MG PO CPDR: 40.0000 mg | DELAYED_RELEASE_CAPSULE | Freq: Every day | ORAL | 0 refills | Status: DC

## 2016-08-10 MED ORDER — GI COCKTAIL ~~LOC~~
30.0000 mL | Freq: Once | ORAL | Status: AC
  Administered 2016-08-10: 30 mL via ORAL

## 2016-08-10 MED ORDER — TRAMADOL HCL 50 MG PO TABS: 50.0000 mg | ORAL_TABLET | Freq: Four times a day (QID) | ORAL | 0 refills | Status: AC | PRN

## 2016-08-10 MED ORDER — NAPROXEN 500 MG PO TABS: 500.0000 mg | ORAL_TABLET | Freq: Two times a day (BID) | ORAL | 0 refills | Status: DC

## 2016-08-10 NOTE — Discharge Instructions (Signed)

## 2016-08-10 NOTE — ED Triage Notes (Signed)
Pt states that he is having lower back pain and problems with his reflux. Pt states that this has been going on for over 1 month but has gotten worse. Pt states that he is unable to stand all the way up because the pain is so severe. Pt in NAD in triage, breathing is equal and unlabored, color WNL.

## 2016-08-10 NOTE — ED Provider Notes (Signed)
[facility] Emergency Department Provider Note    None    (approximate)  I have reviewed the triage vital signs and the nursing notes.   HISTORY  Chief Complaint Back Pain and Gastroesophageal Reflux    HPI Colin Glass is a 31 y.o. male represents added concern forheartburn and low back pain. Patient states that the low back pain has been ongoing for several months. 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 Denies falling or having a sudden onset striking pain. No flank pain. No numbness or tingling. Pain is worsened after laying still for a period of time and then getting up to move. Denies any urinary incontinence or difficulty emptying his bladder. No saddle anesthesia. No fevers. No IV drug abuse.  States is also needing a refill of his antacid medication. States he's had this problem for several years. States it became worse after he ran out of his Zantac. Denies any chest pain. No shortness of breath. States he does smoke cigarettes. No cough.   Past Medical History:  Diagnosis Date  . GERD (gastroesophageal reflux disease)    No family history on file. Past Surgical History:  Procedure Laterality Date  . DENTAL SURGERY    . MOUTH SURGERY     There are no active problems to display for this patient.     Prior to Admission medications   Medication Sig Start Date End Date Taking? Authorizing Provider  albuterol (PROVENTIL HFA;VENTOLIN HFA) 108 (90 BASE) MCG/ACT inhaler Inhale 1-2 puffs into the lungs every 6 (six) hours as needed for wheezing or shortness of breath.    Historical Provider, MD  baclofen (LIORESAL) 10 MG tablet Take 1 tablet (10 mg total) by mouth 3 (three) times daily. 09/24/15   Arlyss Repress, PA-C  naproxen (NAPROSYN) 500 MG tablet Take 1 tablet (500 mg total) by mouth 2 (two) times daily with a meal. 09/24/15   Pierce Crane Beers, PA-C  omeprazole (PRILOSEC) 40 MG capsule Take 1 capsule (40 mg  total) by mouth daily. 11/10/15 11/09/16  Orbie Pyo, MD  ranitidine (ZANTAC) 150 MG tablet Take 1 tablet (150 mg total) by mouth 2 (two) times daily. 09/24/15   Pierce Crane Beers, PA-C  sucralfate (CARAFATE) 1 g tablet Take 1 tablet (1 g total) by mouth 4 (four) times daily. 11/10/15 11/09/16  Orbie Pyo, MD    Allergies Patient has no known allergies.    Social History Social History  Substance Use Topics  . Smoking status: Current Every Day Smoker    Packs/day: 0.50    Types: Cigarettes    Last attempt to quit: 10/21/2014  . Smokeless tobacco: Never Used  . Alcohol use Yes    Review of Systems Patient denies headaches, rhinorrhea, blurry vision, numbness, shortness of breath, chest pain, edema, cough, abdominal pain, nausea, vomiting, diarrhea, dysuria, fevers, rashes or hallucinations unless otherwise stated above in HPI. ____________________________________________   PHYSICAL EXAM:  VITAL SIGNS: Vitals:   08/10/16 1415  BP: 116/73  Pulse: 92  Resp: 18  Temp: 97.9 F (36.6 C)    Constitutional: Alert and oriented. Well appearing and in no acute distress. Eyes: Conjunctivae are normal. PERRL. EOMI. Head: Atraumatic. Nose: No congestion/rhinnorhea. Mouth/Throat: Mucous membranes are moist.  Oropharynx non-erythematous. Neck: No stridor. Painless ROM. No cervical spine tenderness to palpation Hematological/Lymphatic/Immunilogical: No cervical lymphadenopathy. Cardiovascular: Normal rate, regular rhythm. Grossly normal heart sounds.  Good peripheral circulation. Respiratory:  Normal respiratory effort.  No retractions. Lungs CTAB. Gastrointestinal: Soft and nontender. No distention. No abdominal bruits. No CVA tenderness. Musculoskeletal: minimal midline lumbar paraspinal ttp. No overlying erythema or swelling.  No step offs or deformities.  No lower extremity tenderness nor edema.  No joint effusions. Neurologic:  Normal speech and language. No gross  focal neurologic deficits are appreciated. No gait instability.  DTRs 2+ and equal Skin:  Skin is warm, dry and intact. No rash noted. Psychiatric: Mood and affect are normal. Speech and behavior are normal.  ____________________________________________   LABS (all labs ordered are listed, but only abnormal results are displayed)  No results found for this or any previous visit (from the past 24 hour(s)). ____________________________________________  E____________________________________  RADIOLOGY  none ____________________________________________   PROCEDURES  Procedure(s) performed:  Procedures    Critical Care performed: no ____________________________________________   INITIAL IMPRESSION / ASSESSMENT AND PLAN / ED COURSE  Pertinent labs & imaging results that were available during my care of the patient were reviewed by me and considered in my medical decision making (see chart for details).  DDX: reflux, sciatica, msk strain, lumbago  Colin Glass is a 31 y.o. who presents to the ED with complaint of chronic low back pain and acid reflux.  No history of injury or trauma. No recent back instrumentation/procedures. No fevers. Denies cord compression symptoms. No bowel/bladder incontinence or retention, no LE weakness. VSS in ED. Exam with no LE weakness bilat., no sensory deficits, normal DTRs, no clonus, no saddle anesthesia. Pain with palpation of back and with trunk movement. Likely MSK related pain, probable lumbar strain.  Also with chronic reflux symptoms unchanged from previous.  Denies any chest pain or pressure.  Symptoms recurred after stopping zantac. History and physical exam less consistent with kidney stone or pyelonephritis. Treatments will include observation, analgesia, and arrange appropriate follow up for recheck.  Clinical picture is not consistent with epidural abscess , fracture, or cauda equina syndrome. Plan supportive care, follow up for  recheck.  Discsussed conservative treatment.  Will give rx for prilosec, antiinflammatories and muscle relaxant.  Patient was able to tolerate PO and was able to ambulate with a steady gait.  Have discussed with the patient and available family all diagnostics and treatments performed thus far and all questions were answered to the best of my ability. The patient demonstrates understanding and agreement with plan.        ____________________________________________   FINAL CLINICAL IMPRESSION(S) / ED DIAGNOSES  Final diagnoses:  Chronic midline low back pain without sciatica  Heart burn      NEW MEDICATIONS STARTED DURING THIS VISIT:  New Prescriptions   No medications on file     Note:  This document was prepared using Dragon voice recognition software and may include unintentional dictation errors.    Merlyn Lot, MD 08/10/16 [phone]

## 2016-11-03 ENCOUNTER — Emergency Department
Admission: EM | Admit: 2016-11-03 | Discharge: 2016-11-03 | Disposition: A | Discharge status: Home / Self Care | Payer: Self-pay | Attending: Emergency Medicine | Admitting: Emergency Medicine

## 2016-11-03 ENCOUNTER — Emergency Department: Payer: Self-pay

## 2016-11-03 ENCOUNTER — Encounter: Payer: Self-pay | Admitting: Emergency Medicine

## 2016-11-03 DIAGNOSIS — F17210 Nicotine dependence, cigarettes, uncomplicated: Secondary | ICD-10-CM | POA: Insufficient documentation

## 2016-11-03 DIAGNOSIS — Z79899 Other long term (current) drug therapy: Secondary | ICD-10-CM | POA: Insufficient documentation

## 2016-11-03 DIAGNOSIS — Y9389 Activity, other specified: Secondary | ICD-10-CM | POA: Insufficient documentation

## 2016-11-03 DIAGNOSIS — Y999 Unspecified external cause status: Secondary | ICD-10-CM | POA: Insufficient documentation

## 2016-11-03 DIAGNOSIS — Y929 Unspecified place or not applicable: Secondary | ICD-10-CM | POA: Insufficient documentation

## 2016-11-03 DIAGNOSIS — W1789XA Other fall from one level to another, initial encounter: Secondary | ICD-10-CM | POA: Insufficient documentation

## 2016-11-03 DIAGNOSIS — Z791 Long term (current) use of non-steroidal anti-inflammatories (NSAID): Secondary | ICD-10-CM | POA: Insufficient documentation

## 2016-11-03 DIAGNOSIS — S92355A Nondisplaced fracture of fifth metatarsal bone, left foot, initial encounter for closed fracture: Secondary | ICD-10-CM | POA: Insufficient documentation

## 2016-11-03 MED ORDER — NAPROXEN 500 MG PO TABS: 500.0000 mg | ORAL_TABLET | Freq: Two times a day (BID) | ORAL | 0 refills | Status: DC

## 2016-11-03 MED ORDER — HYDROCODONE-ACETAMINOPHEN 5-325 MG PO TABS: 1.0000 | ORAL_TABLET | ORAL | 0 refills | Status: DC | PRN

## 2016-11-03 MED ORDER — NAPROXEN 500 MG PO TABS
500.0000 mg | ORAL_TABLET | Freq: Once | ORAL | Status: AC
  Administered 2016-11-03: 500 mg via ORAL
  Filled 2016-11-03: qty 1

## 2016-11-03 NOTE — ED Provider Notes (Signed)
[facility] Emergency Department Provider Note ____________________________________________  Time seen: Approximately 11:03 AM  I have reviewed the triage vital signs and the nursing notes.   HISTORY  Chief Complaint Foot Pain    HPI Colin Glass is a 31 y.o. male who presents to the emergency department for treatment and evaluation of left foot pain. Yesterday, he attempted to jump over something that was in the floor and landed awkwardly. He states that the pain has continued and this morning he noticed that his foot was swollen and red. He denies other injury. He has not taken any medications for his pain since the incident.  Past Medical History:  Diagnosis Date  . GERD (gastroesophageal reflux disease)     There are no active problems to display for this patient.   Past Surgical History:  Procedure Laterality Date  . DENTAL SURGERY    . MOUTH SURGERY      Prior to Admission medications   Medication Sig Start Date End Date Taking? Authorizing Provider  albuterol (PROVENTIL HFA;VENTOLIN HFA) 108 (90 BASE) MCG/ACT inhaler Inhale 1-2 puffs into the lungs every 6 (six) hours as needed for wheezing or shortness of breath.    [provider]  baclofen (LIORESAL) 10 MG tablet Take 1 tablet (10 mg total) by mouth 3 (three) times daily. 09/24/15   Beers, Pierce Crane, PA-C  cyclobenzaprine (FLEXERIL) 10 MG tablet Take 1 tablet (10 mg total) by mouth 3 (three) times daily as needed for muscle spasms. 08/10/16   Merlyn Lot, MD  HYDROcodone-acetaminophen (NORCO/VICODIN) 5-325 MG tablet Take 1 tablet by mouth every 4 (four) hours as needed for moderate pain. 11/03/16 11/03/17  Marquavius Scaife, Johnette Abraham B, FNP  naproxen (NAPROSYN) 500 MG tablet Take 1 tablet (500 mg total) by mouth 2 (two) times daily with a meal. 11/03/16   Agastya Meister B, FNP  omeprazole (PRILOSEC) 40 MG capsule Take 1 capsule (40 mg total) by mouth daily. 08/10/16 08/10/17  Merlyn Lot, MD   ranitidine (ZANTAC) 150 MG tablet Take 1 tablet (150 mg total) by mouth 2 (two) times daily. 09/24/15   Beers, Pierce Crane, PA-C  sucralfate (CARAFATE) 1 g tablet Take 1 tablet (1 g total) by mouth 4 (four) times daily. 11/10/15 11/09/16  Schaevitz, Randall An, MD  traMADol (ULTRAM) 50 MG tablet Take 1 tablet (50 mg total) by mouth every 6 (six) hours as needed. 08/10/16 08/10/17  Merlyn Lot, MD    Allergies Patient has no known allergies.  No family history on file.  Social History Social History  Substance Use Topics  . Smoking status: Current Every Day Smoker    Packs/day: 0.50    Types: Cigarettes    Last attempt to quit: 10/21/2014  . Smokeless tobacco: Never Used  . Alcohol use Yes    Review of Systems Constitutional: Negative for recent illness. Cardiovascular: Negative for noted change in color or temperature of the extremity. Respiratory: Negative for shortness of breath. Musculoskeletal: Positive for left foot pain. Skin: Positive for left foot swelling and erythema. Negative for open lesion or wound.  Neurological: Negative for decrease in sensation, paresthesias, or radicular symptoms.  ____________________________________________   PHYSICAL EXAM:  VITAL SIGNS: ED Triage Vitals  Enc Vitals Group     BP 11/03/16 1041 130/65     Pulse Rate 11/03/16 1041 81     Resp 11/03/16 1041 18     Temp 11/03/16 1041 98.3 F (36.8 C)     Temp Source 11/03/16 1041 Oral  SpO2 11/03/16 1041 99 %     Weight 11/03/16 1042 175 lb (79.4 kg)     Height 11/03/16 1042 6\' 2"  (1.88 m)     Head Circumference --      Peak Flow --      Pain Score 11/03/16 1042 10     Pain Loc --      Pain Edu? --      Excl. in [place]? --     Constitutional: Alert and oriented. Well appearing and in no acute distress. Eyes: Conjunctivae are clear without discharge or drainage.  Head: Atraumatic. Neck: Nexus criteria is negative. Active, full range of motion is observed. Respiratory: Respirations  are even and unlabored. Musculoskeletal: Tenderness to palpation over the left mid foot, specifically the fourth and fifth metacarpals with decreased range of motion of the foot secondary to pain. Neurologic: Dull and sharp sensation is intact.  Skin: Skin overlying the left mid foot is mildly erythematous with small amount of swelling over the fourth and fifth tarsals. Psychiatric: Affect and behavior are appropriate.  ____________________________________________   LABS (all labs ordered are listed, but only abnormal results are displayed)  Labs Reviewed - No data to display ____________________________________________  RADIOLOGY  Nondisplaced, transverse fracture at the base of the left fifth metatarsal per radiology. ____________________________________________   PROCEDURES  Procedure(s) performed: Ace bandage and cast shoe applied to the left foot by ER tech. Crutches given with crutch training. Patient neurovascularly intact post-application of the splint.  ____________________________________________   INITIAL IMPRESSION / ASSESSMENT AND PLAN / ED COURSE  Colin Glass is a 31 y.o. male who presents to the emergency department for evaluation of left foot pain after a mechanical injury yesterday. X-ray reveals a transverse fracture of the fifth metatarsal. Compression and cast shoe applied. Patient was instructed to follow-up with podiatry and avoid weightbearing. He will use crutches. He was instructed to call Monday morning to schedule a follow-up appointment. He was instructed to return to the emergency department for any symptom that changes or worsens if he is unable to schedule an appointment.  Pertinent labs & imaging results that were available during my care of the patient were reviewed by me and considered in my medical decision making (see chart for details).  _________________________________________   FINAL CLINICAL IMPRESSION(S) / ED DIAGNOSES  Final  diagnoses:  Closed nondisplaced fracture of fifth metatarsal bone of left foot, initial encounter    Discharge Medication List as of 11/03/2016 12:17 PM    START taking these medications   Details  HYDROcodone-acetaminophen (NORCO/VICODIN) 5-325 MG tablet Take 1 tablet by mouth every 4 (four) hours as needed for moderate pain., Starting Sat 11/03/2016, Until Sun 11/03/2017, Print        If controlled substance prescribed during this visit, 12 month history viewed on the [place] prior to issuing an initial prescription for Schedule II or III opiod.    Victorino Dike, FNP 11/04/16 1358    Lisa Roca, MD 11/04/16 2107

## 2016-11-03 NOTE — Discharge Instructions (Signed)
Rest, ice, and elevate your foot as often as you can throughout the day. Follow up with the podiatrist. Return to the ER for symptoms that change or worsen if unable to schedule an appointment.

## 2016-11-03 NOTE — ED Triage Notes (Signed)
Patient to ER for c/o left foot pain and swelling after attempting to jump over something and "landing on it wrong". Patient able to walk, but painful.

## 2016-11-03 NOTE — ED Notes (Signed)
Pt taught how to use crutches. Ace wrap and post of shoe applied.

## 2016-11-10 IMAGING — CR DG CHEST 2V
2 series · 2 of 2 positions shown · non-contrast
Comparison: 11/04/2014

CLINICAL DATA: Chest pain

EXAM:
CHEST  2 VIEW

[chest pa]
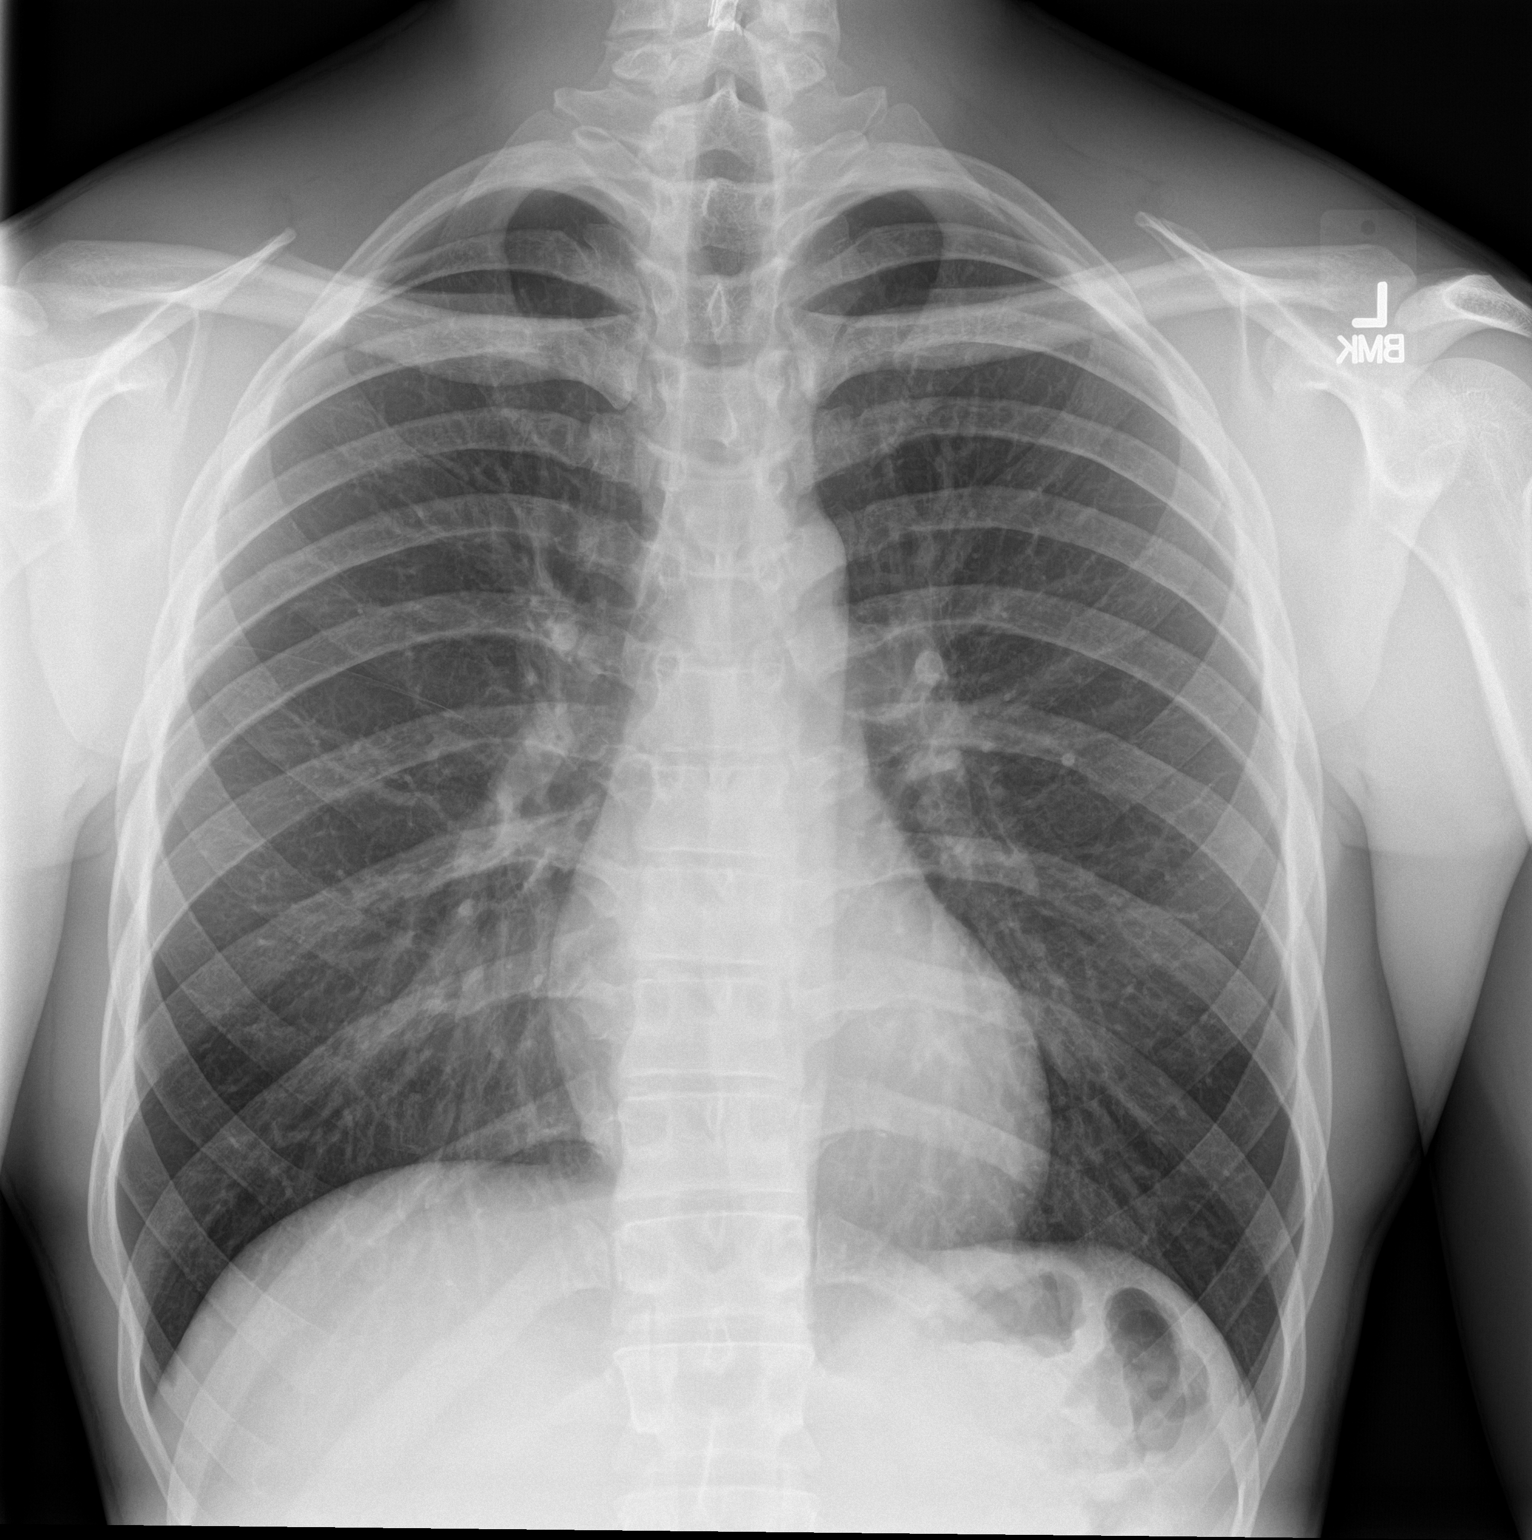

[chest lat]
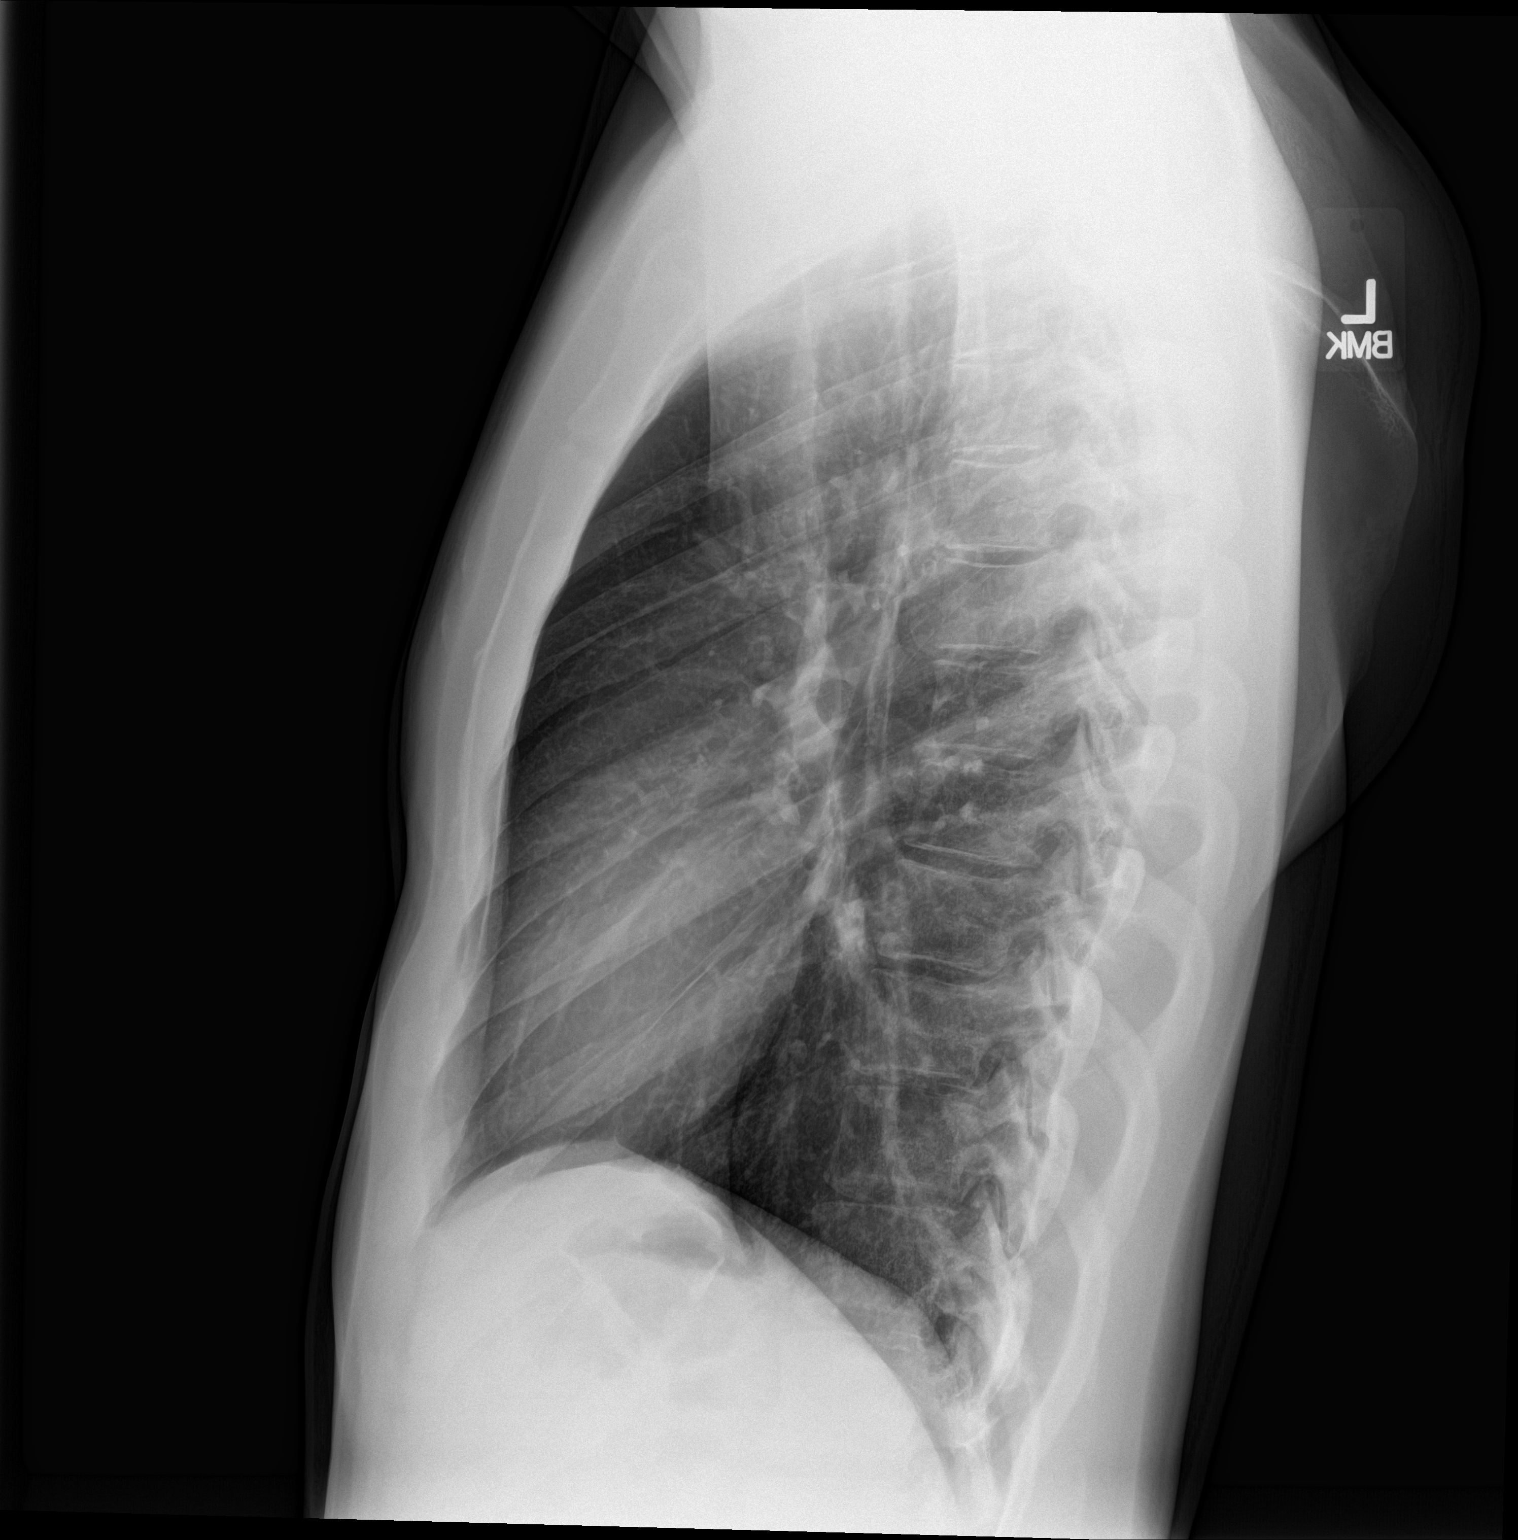

[2 of 2 positions shown; findings below may reference images not displayed]

FINDINGS: Normal heart size and mediastinal contours. No acute infiltrate or
edema. No effusion or pneumothorax. No acute osseous findings.
IMPRESSION: Negative chest.

## 2016-11-15 ENCOUNTER — Ambulatory Visit: Payer: Self-pay | Admitting: Podiatry

## 2017-01-12 ENCOUNTER — Encounter: Payer: Self-pay | Admitting: Emergency Medicine

## 2017-01-12 ENCOUNTER — Emergency Department
Admission: EM | Admit: 2017-01-12 | Discharge: 2017-01-12 | Disposition: A | Discharge status: Home / Self Care | Payer: Self-pay | Attending: Emergency Medicine | Admitting: Emergency Medicine

## 2017-01-12 DIAGNOSIS — F17210 Nicotine dependence, cigarettes, uncomplicated: Secondary | ICD-10-CM | POA: Insufficient documentation

## 2017-01-12 DIAGNOSIS — Z79899 Other long term (current) drug therapy: Secondary | ICD-10-CM | POA: Insufficient documentation

## 2017-01-12 DIAGNOSIS — N4889 Other specified disorders of penis: Secondary | ICD-10-CM | POA: Insufficient documentation

## 2017-01-12 DIAGNOSIS — N342 Other urethritis: Secondary | ICD-10-CM

## 2017-01-12 DIAGNOSIS — N341 Nonspecific urethritis: Secondary | ICD-10-CM | POA: Insufficient documentation

## 2017-01-12 DIAGNOSIS — R110 Nausea: Secondary | ICD-10-CM | POA: Insufficient documentation

## 2017-01-12 LAB — URINALYSIS, COMPLETE (UACMP) WITH MICROSCOPIC
BILIRUBIN URINE: NEGATIVE
Bacteria, UA: NONE SEEN
Glucose, UA: NEGATIVE mg/dL
HGB URINE DIPSTICK: NEGATIVE
Ketones, ur: NEGATIVE mg/dL
LEUKOCYTES UA: NEGATIVE
NITRITE: NEGATIVE
PROTEIN: NEGATIVE mg/dL
Specific Gravity, Urine: 1.018 (ref 1.005–1.030)
pH: 6 (ref 5.0–8.0)

## 2017-01-12 LAB — CHLAMYDIA/NGC RT PCR (ARMC ONLY)
Chlamydia Tr: DETECTED — AB
N gonorrhoeae: NOT DETECTED

## 2017-01-12 LAB — CBC
HEMATOCRIT: 46.2 % (ref 40.0–52.0)
HEMOGLOBIN: 15.7 g/dL (ref 13.0–18.0)
MCH: 28.8 pg (ref 26.0–34.0)
MCHC: 34 g/dL (ref 32.0–36.0)
MCV: 84.7 fL (ref 80.0–100.0)
Platelets: 212 10*3/uL (ref 150–440)
RBC: 5.45 MIL/uL (ref 4.40–5.90)
RDW: 14.6 % — ABNORMAL HIGH (ref 11.5–14.5)
WBC: 7.7 10*3/uL (ref 3.8–10.6)

## 2017-01-12 LAB — COMPREHENSIVE METABOLIC PANEL
ALBUMIN: 4.8 g/dL (ref 3.5–5.0)
ALT: 20 U/L (ref 17–63)
ANION GAP: 8 (ref 5–15)
AST: 21 U/L (ref 15–41)
Alkaline Phosphatase: 59 U/L (ref 38–126)
BILIRUBIN TOTAL: 1.3 mg/dL — AB (ref 0.3–1.2)
BUN: 8 mg/dL (ref 6–20)
CO2: 30 mmol/L (ref 22–32)
Calcium: 9.1 mg/dL (ref 8.9–10.3)
Chloride: 104 mmol/L (ref 101–111)
Creatinine, Ser: 1.28 mg/dL — ABNORMAL HIGH (ref 0.61–1.24)
GFR calc Af Amer: >60 mL/min (ref >60)
GFR calc non Af Amer: >60 mL/min (ref >60)
GLUCOSE: 109 mg/dL — AB (ref 65–99)
POTASSIUM: 3.9 mmol/L (ref 3.5–5.1)
Sodium: 142 mmol/L (ref 135–145)
TOTAL PROTEIN: 7.6 g/dL (ref 6.5–8.1)

## 2017-01-12 LAB — LIPASE, BLOOD: Lipase: 48 U/L (ref 11–51)

## 2017-01-12 MED ORDER — CEFTRIAXONE SODIUM 250 MG IJ SOLR
250.0000 mg | Freq: Once | INTRAMUSCULAR | Status: AC
  Administered 2017-01-12: 250 mg via INTRAMUSCULAR
  Filled 2017-01-12: qty 250

## 2017-01-12 MED ORDER — ONDANSETRON 4 MG PO TBDP
8.0000 mg | ORAL_TABLET | Freq: Once | ORAL | Status: AC
  Administered 2017-01-12: 8 mg via ORAL
  Filled 2017-01-12: qty 2

## 2017-01-12 MED ORDER — AZITHROMYCIN 500 MG PO TABS
1000.0000 mg | ORAL_TABLET | Freq: Once | ORAL | Status: AC
  Administered 2017-01-12: 1000 mg via ORAL
  Filled 2017-01-12: qty 2

## 2017-01-12 NOTE — ED Triage Notes (Signed)
Patient to ER for c/o pain to bilateral flanks and bilateral lower abdomen x4 days. Reports nausea the first day, but not since then. Denies diarrhea or fevers.

## 2017-01-12 NOTE — ED Provider Notes (Signed)
[facility] Emergency Department Provider Note ____________________________________________   First MD Initiated Contact with Patient 01/12/17 1053     (approximate)  I have reviewed the triage vital signs and the nursing notes.   HISTORY  Chief Complaint Abdominal Pain and Flank Pain    HPI Colin Glass is a 31 y.o. male Colin Glass with suprapubic abdominal discomfort for the last several days, gradual onset, associated with penile discomfort at the end of urination.  He states the suprapubic discomfort is midline and constant. He reports mild nausea but no associated vomiting. There is no testicular pain or swelling. No rashes, lesions, or swollen lymph nodes.  He states he is sexually active with multiple partners.  Past Medical History:  Diagnosis Date  . GERD (gastroesophageal reflux disease)     There are no active problems to display for this patient.   Past Surgical History:  Procedure Laterality Date  . DENTAL SURGERY    . MOUTH SURGERY      Prior to Admission medications   Medication Sig Start Date End Date Taking? Authorizing Provider  albuterol (PROVENTIL HFA;VENTOLIN HFA) 108 (90 BASE) MCG/ACT inhaler Inhale 1-2 puffs into the lungs every 6 (six) hours as needed for wheezing or shortness of breath.    [provider]  baclofen (LIORESAL) 10 MG tablet Take 1 tablet (10 mg total) by mouth 3 (three) times daily. 09/24/15   Beers, Pierce Crane, PA-C  cyclobenzaprine (FLEXERIL) 10 MG tablet Take 1 tablet (10 mg total) by mouth 3 (three) times daily as needed for muscle spasms. 08/10/16   Merlyn Lot, MD  HYDROcodone-acetaminophen (NORCO/VICODIN) 5-325 MG tablet Take 1 tablet by mouth every 4 (four) hours as needed for moderate pain. 11/03/16 11/03/17  Triplett, Johnette Abraham B, FNP  naproxen (NAPROSYN) 500 MG tablet Take 1 tablet (500 mg total) by mouth 2 (two) times daily with a meal. 11/03/16   Triplett, Cari B, FNP  omeprazole (PRILOSEC) 40 MG  capsule Take 1 capsule (40 mg total) by mouth daily. 08/10/16 08/10/17  Merlyn Lot, MD  ranitidine (ZANTAC) 150 MG tablet Take 1 tablet (150 mg total) by mouth 2 (two) times daily. 09/24/15   Beers, Pierce Crane, PA-C  sucralfate (CARAFATE) 1 g tablet Take 1 tablet (1 g total) by mouth 4 (four) times daily. 11/10/15 11/09/16  Schaevitz, Randall An, MD  traMADol (ULTRAM) 50 MG tablet Take 1 tablet (50 mg total) by mouth every 6 (six) hours as needed. 08/10/16 08/10/17  Merlyn Lot, MD    Allergies Patient has no known allergies.  No family history on file.  Social History Social History  Substance Use Topics  . Smoking status: Current Every Day Smoker    Packs/day: 0.50    Types: Cigarettes    Last attempt to quit: 10/21/2014  . Smokeless tobacco: Never Used  . Alcohol use Yes    Review of Systems  Constitutional: No fever/chills Eyes: No visual changes. ENT: No sore throat. Cardiovascular: Denies chest pain. Respiratory: Denies shortness of breath. Gastrointestinal: No vomiting or diarrhea.  Genitourinary: positive for dysuria.  Musculoskeletal: positive for back pain. Skin: Negative for rash. Neurological: Negative for headaches, focal weakness or numbness.   ____________________________________________   PHYSICAL EXAM:  VITAL SIGNS: ED Triage Vitals  Enc Vitals Group     BP 01/12/17 0829 126/84     Pulse Rate 01/12/17 0829 94     Resp 01/12/17 0829 18     Temp 01/12/17 0829 97.7 F (36.5 C)  Temp Source 01/12/17 0829 Oral     SpO2 01/12/17 0829 98 %     Weight 01/12/17 0825 175 lb (79.4 kg)     Height 01/12/17 0825 6\' 2"  (1.88 m)     Head Circumference --      Peak Flow --      Pain Score 01/12/17 0824 7     Pain Loc --      Pain Edu? --      Excl. in [place]? --     Constitutional: Alert and oriented. Well appearing and in no acute distress. Eyes: Conjunctivae are normal.  Head: Atraumatic. Nose: No congestion/rhinnorhea. Mouth/Throat: Mucous membranes  are moist.   Neck: Normal range of motion.  Cardiovascular:  Good peripheral circulation. Respiratory: Normal respiratory effort.   Gastrointestinal: Soft and nontender. No distention.  Genitourinary: No CVA tenderness.  normal external genitalia. No penile discharge. Testes nontender, no masses. No inguinal lymphadenopathy. No rashes or lesions. Musculoskeletal: No lower extremity edema.  Extremities warm and well perfused.  Neurologic:  Normal speech and language. No gross focal neurologic deficits are appreciated.  Skin:  Skin is warm and dry. No rash noted. Psychiatric: Mood and affect are normal. Speech and behavior are normal.  ____________________________________________   LABS (all labs ordered are listed, but only abnormal results are displayed)  Labs Reviewed  CHLAMYDIA/NGC RT PCR ([facility] ONLY) - Abnormal; Notable for the following:       Result Value   Chlamydia Tr DETECTED (*)    All other components within normal limits  COMPREHENSIVE METABOLIC PANEL - Abnormal; Notable for the following:    Glucose, Bld 109 (*)    Creatinine, Ser 1.28 (*)    Total Bilirubin 1.3 (*)    All other components within normal limits  CBC - Abnormal; Notable for the following:    RDW 14.6 (*)    All other components within normal limits  URINALYSIS, COMPLETE (UACMP) WITH MICROSCOPIC - Abnormal; Notable for the following:    Color, Urine YELLOW (*)    APPearance CLEAR (*)    Squamous Epithelial / LPF 0-5 (*)    All other components within normal limits  LIPASE, BLOOD   ____________________________________________  EKG   ____________________________________________  RADIOLOGY    ____________________________________________   PROCEDURES  Procedure(s) performed: No    Critical Care performed: No ____________________________________________   INITIAL IMPRESSION / ASSESSMENT AND PLAN / ED COURSE  Pertinent labs & imaging results that were available during my care  of the patient were reviewed by me and considered in my medical decision making (see chart for details).  31 year old male presents with suprapubic abdominal discomfort as well as dysuria for the last several days. Patient states she is sexually active with multiple partners. On exam, vital signs are normal, patient is very well-appearing, and the exam is unremarkable; abdomen is soft with no tenderness and the GU exam is normal. Presentation is consistent with STI. UA is negative so do not suspect UTI/cystitis, and given no significant tenderness on exam and midline suprapubic pain there is no evidence to suggest appendicitis or other acute intra-abdominal cause. Plan for empiric treatment for gonorrhea and chlamydia.  I gave patient return precautions and answered all his questions.      ____________________________________________   FINAL CLINICAL IMPRESSION(S) / ED DIAGNOSES  Final diagnoses:  Urethritis      NEW MEDICATIONS STARTED DURING THIS VISIT:  Discharge Medication List as of 01/12/2017 11:45 AM       Note:  This document was prepared using Dragon voice recognition software and may include unintentional dictation errors.    Arta Silence, MD 01/12/17 [phone]

## 2017-01-12 NOTE — Discharge Instructions (Signed)
Return to the ER for new or worsening abdominal pain, persistent pain, pain in the right lower part of the abdomen, vomiting or fever, or any other new or worsening symptoms that concern you.

## 2017-01-14 ENCOUNTER — Emergency Department
Admission: EM | Admit: 2017-01-14 | Discharge: 2017-01-15 | Disposition: A | Discharge status: Home / Self Care | Payer: Self-pay | Attending: Emergency Medicine | Admitting: Emergency Medicine

## 2017-01-14 ENCOUNTER — Encounter: Payer: Self-pay | Admitting: Emergency Medicine

## 2017-01-14 ENCOUNTER — Telehealth: Payer: Self-pay | Admitting: Emergency Medicine

## 2017-01-14 DIAGNOSIS — Z79899 Other long term (current) drug therapy: Secondary | ICD-10-CM | POA: Insufficient documentation

## 2017-01-14 DIAGNOSIS — F17210 Nicotine dependence, cigarettes, uncomplicated: Secondary | ICD-10-CM | POA: Insufficient documentation

## 2017-01-14 DIAGNOSIS — K219 Gastro-esophageal reflux disease without esophagitis: Secondary | ICD-10-CM | POA: Insufficient documentation

## 2017-01-14 DIAGNOSIS — K852 Alcohol induced acute pancreatitis without necrosis or infection: Secondary | ICD-10-CM | POA: Insufficient documentation

## 2017-01-14 DIAGNOSIS — R1011 Right upper quadrant pain: Secondary | ICD-10-CM

## 2017-01-14 LAB — CBC
HEMATOCRIT: 46.8 % (ref 40.0–52.0)
HEMOGLOBIN: 16.1 g/dL (ref 13.0–18.0)
MCH: 29 pg (ref 26.0–34.0)
MCHC: 34.4 g/dL (ref 32.0–36.0)
MCV: 84.4 fL (ref 80.0–100.0)
Platelets: 217 10*3/uL (ref 150–440)
RBC: 5.54 MIL/uL (ref 4.40–5.90)
RDW: 14.7 % — ABNORMAL HIGH (ref 11.5–14.5)
WBC: 8.8 10*3/uL (ref 3.8–10.6)

## 2017-01-14 LAB — URINALYSIS, COMPLETE (UACMP) WITH MICROSCOPIC
BACTERIA UA: NONE SEEN
Bilirubin Urine: NEGATIVE
Glucose, UA: NEGATIVE mg/dL
HGB URINE DIPSTICK: NEGATIVE
KETONES UR: NEGATIVE mg/dL
LEUKOCYTES UA: NEGATIVE
Nitrite: NEGATIVE
Protein, ur: NEGATIVE mg/dL
Specific Gravity, Urine: 1.025 (ref 1.005–1.030)
pH: 5 (ref 5.0–8.0)

## 2017-01-14 LAB — COMPREHENSIVE METABOLIC PANEL
ALK PHOS: 59 U/L (ref 38–126)
ALT: 25 U/L (ref 17–63)
ANION GAP: 6 (ref 5–15)
AST: 28 U/L (ref 15–41)
Albumin: 4.5 g/dL (ref 3.5–5.0)
BUN: 13 mg/dL (ref 6–20)
CO2: 30 mmol/L (ref 22–32)
Calcium: 9.5 mg/dL (ref 8.9–10.3)
Chloride: 103 mmol/L (ref 101–111)
Creatinine, Ser: 1.22 mg/dL (ref 0.61–1.24)
GFR calc Af Amer: >60 mL/min (ref >60)
Glucose, Bld: 113 mg/dL — ABNORMAL HIGH (ref 65–99)
Potassium: 3.5 mmol/L (ref 3.5–5.1)
SODIUM: 139 mmol/L (ref 135–145)
Total Bilirubin: 2 mg/dL — ABNORMAL HIGH (ref 0.3–1.2)
Total Protein: 7.3 g/dL (ref 6.5–8.1)

## 2017-01-14 LAB — LIPASE, BLOOD: LIPASE: 73 U/L — AB (ref 11–51)

## 2017-01-14 LAB — TROPONIN I

## 2017-01-14 NOTE — ED Triage Notes (Signed)
Pt c/o all quadrant abdominal pain that started approximately 30 minutes ago. Pt denies N/V/D.

## 2017-01-14 NOTE — Telephone Encounter (Signed)
Called patient to inform of std results.  Gave information on partner treatment and availability of [facility] std clinic.

## 2017-01-14 NOTE — ED Notes (Signed)
ED Provider at bedside. 

## 2017-01-15 ENCOUNTER — Emergency Department: Payer: Self-pay

## 2017-01-15 MED ORDER — OXYCODONE-ACETAMINOPHEN 5-325 MG PO TABS: 1.0000 | ORAL_TABLET | ORAL | 0 refills | Status: DC | PRN

## 2017-01-15 MED ORDER — ONDANSETRON 4 MG PO TBDP: 4.0000 mg | ORAL_TABLET | Freq: Three times a day (TID) | ORAL | 0 refills | Status: DC | PRN

## 2017-01-15 NOTE — ED Notes (Signed)
Patient transported to Ultrasound 

## 2017-01-15 NOTE — ED Provider Notes (Signed)
[facility] Emergency Department Provider Note    First MD Initiated Contact with Patient 01/14/17 2350     (approximate)  I have reviewed the triage vital signs and the nursing notes.   HISTORY  Chief Complaint Abdominal Pain   HPI Colin Glass is a 31 y.o. male history of GERD and recently diagnosed chlamydia urethritis 2 days ago presents to the emergency department with epigastric abdominal discomfort with radiation to the midline of the chest. Patient stated pain started yesterday. Patient does admit to drinking 2 pints of alcohol yesterday before onset of pain. Patient denies any vomiting. Patient denies any fever. Patient denies any dyspnea   Past Medical History:  Diagnosis Date  . GERD (gastroesophageal reflux disease)     There are no active problems to display for this patient.   Past Surgical History:  Procedure Laterality Date  . DENTAL SURGERY    . MOUTH SURGERY      Prior to Admission medications   Medication Sig Start Date End Date Taking? Authorizing Provider  albuterol (PROVENTIL HFA;VENTOLIN HFA) 108 (90 BASE) MCG/ACT inhaler Inhale 1-2 puffs into the lungs every 6 (six) hours as needed for wheezing or shortness of breath.    [provider]  baclofen (LIORESAL) 10 MG tablet Take 1 tablet (10 mg total) by mouth 3 (three) times daily. 09/24/15   Beers, Pierce Crane, PA-C  cyclobenzaprine (FLEXERIL) 10 MG tablet Take 1 tablet (10 mg total) by mouth 3 (three) times daily as needed for muscle spasms. 08/10/16   Merlyn Lot, MD  HYDROcodone-acetaminophen (NORCO/VICODIN) 5-325 MG tablet Take 1 tablet by mouth every 4 (four) hours as needed for moderate pain. 11/03/16 11/03/17  Triplett, Johnette Abraham B, FNP  naproxen (NAPROSYN) 500 MG tablet Take 1 tablet (500 mg total) by mouth 2 (two) times daily with a meal. 11/03/16   Triplett, Cari B, FNP  omeprazole (PRILOSEC) 40 MG capsule Take 1 capsule (40 mg total) by mouth daily. 08/10/16 08/10/17   Merlyn Lot, MD  ranitidine (ZANTAC) 150 MG tablet Take 1 tablet (150 mg total) by mouth 2 (two) times daily. 09/24/15   Beers, Pierce Crane, PA-C  sucralfate (CARAFATE) 1 g tablet Take 1 tablet (1 g total) by mouth 4 (four) times daily. 11/10/15 11/09/16  Schaevitz, Randall An, MD  traMADol (ULTRAM) 50 MG tablet Take 1 tablet (50 mg total) by mouth every 6 (six) hours as needed. 08/10/16 08/10/17  Merlyn Lot, MD    Allergies no known drug allergies History reviewed. No pertinent family history.  Social History Social History  Substance Use Topics  . Smoking status: Current Every Day Smoker    Packs/day: 0.50    Types: Cigarettes    Last attempt to quit: 10/21/2014  . Smokeless tobacco: Never Used  . Alcohol use Yes    Review of Systems Constitutional: No fever/chills Eyes: No visual changes. ENT: No sore throat. Cardiovascular: Denies chest pain. Respiratory: Denies shortness of breath. Gastrointestinal:positive for abdominal pain.  No nausea, no vomiting.  No diarrhea.  No constipation. Genitourinary: Negative for dysuria. Musculoskeletal: Negative for neck pain.  Negative for back pain. Integumentary: Negative for rash. Neurological: Negative for headaches, focal weakness or numbness.  ____________________________________________   PHYSICAL EXAM:  VITAL SIGNS: ED Triage Vitals  Enc Vitals Group     BP 01/14/17 2252 120/71     Pulse Rate 01/14/17 2252 66     Resp 01/14/17 2252 15     Temp 01/14/17 2252 98.3 F (  36.8 C)     Temp Source 01/14/17 2252 Oral     SpO2 01/14/17 2252 100 %     Weight 01/14/17 2246 79.4 kg (175 lb)     Height --      Head Circumference --      Peak Flow --      Pain Score 01/14/17 2356 8     Pain Loc --      Pain Edu? --      Excl. in [place]? --     Constitutional: Alert and oriented. Well appearing and in no acute distress. Eyes: Conjunctivae are normal. Head: Atraumatic. Mouth/Throat: Mucous membranes are moist.  Oropharynx  non-erythematous. Neck: No stridor.   Cardiovascular: Normal rate, regular rhythm. Good peripheral circulation. Grossly normal heart sounds. Respiratory: Normal respiratory effort.  No retractions. Lungs CTAB. Gastrointestinal: epigastric tenderness palpation. No distention.  Musculoskeletal: No lower extremity tenderness nor edema. No gross deformities of extremities. Neurologic:  Normal speech and language. No gross focal neurologic deficits are appreciated.  Skin:  Skin is warm, dry and intact. No rash noted. Psychiatric: Mood and affect are normal. Speech and behavior are normal.  ____________________________________________   LABS (all labs ordered are listed, but only abnormal results are displayed)  Labs Reviewed  LIPASE, BLOOD - Abnormal; Notable for the following:       Result Value   Lipase 73 (*)    All other components within normal limits  COMPREHENSIVE METABOLIC PANEL - Abnormal; Notable for the following:    Glucose, Bld 113 (*)    Total Bilirubin 2.0 (*)    All other components within normal limits  CBC - Abnormal; Notable for the following:    RDW 14.7 (*)    All other components within normal limits  URINALYSIS, COMPLETE (UACMP) WITH MICROSCOPIC - Abnormal; Notable for the following:    Color, Urine YELLOW (*)    APPearance CLEAR (*)    Squamous Epithelial / LPF 0-5 (*)    All other components within normal limits  TROPONIN I   ____________________________________________  EKG  ED ECG REPORT I, [place] N Kortez Murtagh, the attending physician, personally viewed and interpreted this ECG.   Date: 01/15/2017  EKG Time: 10:59 PM  Rate: 58  Rhythm: sinus bradycardia  Axis: normal  Intervals:normal  ST&T Change:none    Procedures   ____________________________________________   INITIAL IMPRESSION / ASSESSMENT AND PLAN / ED COURSE  Pertinent labs & imaging results that were available during my care of the patient were reviewed by me and considered in my  medical decision making (see chart for details).  31 year old male presenting with history of physical exam concerning for possible alcohol-induced pancreatitis which was confirmed with a lipase of 73. right upper quadrantUltrasound revealed as negative      ____________________________________________  FINAL CLINICAL IMPRESSION(S) / ED DIAGNOSES  Final diagnoses:  RUQ pain  Alcohol-induced acute pancreatitis without infection or necrosis     MEDICATIONS GIVEN DURING THIS VISIT:  Medications - No data to display   NEW OUTPATIENT MEDICATIONS STARTED DURING THIS VISIT:  New Prescriptions   No medications on file    Modified Medications   No medications on file    Discontinued Medications   No medications on file     Note:  This document was prepared using Dragon voice recognition software and may include unintentional dictation errors.    Gregor Hams, MD 01/15/17 [phone]

## 2017-01-15 NOTE — ED Notes (Signed)
Pt reports daily drinker. Last drink 2 pints liquor yesterday.

## 2017-03-18 ENCOUNTER — Other Ambulatory Visit: Payer: Self-pay

## 2017-03-18 ENCOUNTER — Emergency Department
Admission: EM | Admit: 2017-03-18 | Discharge: 2017-03-18 | Disposition: A | Discharge status: Home / Self Care | Payer: Self-pay | Attending: Emergency Medicine | Admitting: Emergency Medicine

## 2017-03-18 ENCOUNTER — Emergency Department: Payer: Self-pay

## 2017-03-18 DIAGNOSIS — R1013 Epigastric pain: Secondary | ICD-10-CM | POA: Insufficient documentation

## 2017-03-18 DIAGNOSIS — R142 Eructation: Secondary | ICD-10-CM | POA: Insufficient documentation

## 2017-03-18 DIAGNOSIS — F17210 Nicotine dependence, cigarettes, uncomplicated: Secondary | ICD-10-CM | POA: Insufficient documentation

## 2017-03-18 DIAGNOSIS — F1010 Alcohol abuse, uncomplicated: Secondary | ICD-10-CM | POA: Insufficient documentation

## 2017-03-18 LAB — BASIC METABOLIC PANEL
ANION GAP: 8 (ref 5–15)
BUN: 12 mg/dL (ref 6–20)
CHLORIDE: 101 mmol/L (ref 101–111)
CO2: 29 mmol/L (ref 22–32)
Calcium: 9.1 mg/dL (ref 8.9–10.3)
Creatinine, Ser: 1.05 mg/dL (ref 0.61–1.24)
GFR calc non Af Amer: >60 mL/min (ref >60)
Glucose, Bld: 102 mg/dL — ABNORMAL HIGH (ref 65–99)
POTASSIUM: 3.7 mmol/L (ref 3.5–5.1)
Sodium: 138 mmol/L (ref 135–145)

## 2017-03-18 LAB — CBC
HCT: 47.3 % (ref 40.0–52.0)
HEMOGLOBIN: 15.7 g/dL (ref 13.0–18.0)
MCH: 28.7 pg (ref 26.0–34.0)
MCHC: 33.2 g/dL (ref 32.0–36.0)
MCV: 86.7 fL (ref 80.0–100.0)
Platelets: 226 10*3/uL (ref 150–440)
RBC: 5.46 MIL/uL (ref 4.40–5.90)
RDW: 14.1 % (ref 11.5–14.5)
WBC: 7 10*3/uL (ref 3.8–10.6)

## 2017-03-18 LAB — TROPONIN I

## 2017-03-18 MED ORDER — GI COCKTAIL ~~LOC~~
30.0000 mL | Freq: Once | ORAL | Status: AC
  Administered 2017-03-18: 30 mL via ORAL

## 2017-03-18 MED ORDER — GI COCKTAIL ~~LOC~~
ORAL | Status: AC
  Filled 2017-03-18: qty 30

## 2017-03-18 MED ORDER — OMEPRAZOLE 40 MG PO CPDR: 40.0000 mg | DELAYED_RELEASE_CAPSULE | Freq: Every day | ORAL | 0 refills | Status: DC

## 2017-03-18 NOTE — Discharge Instructions (Signed)
Please continue to take the Zantac as prescribed, and add omeprazole daily.  Please stop drinking alcohol or only drinking small doses, and follow the GERD diet recommendations attached to this sheet.  Return to the emergency department if you develop severe pain, lightheadedness or fainting, shortness of breath, or any other symptoms concerning to you.

## 2017-03-18 NOTE — ED Notes (Signed)
ED Provider at bedside. 

## 2017-03-18 NOTE — ED Triage Notes (Signed)
Patient reports having right sided chest pain/rib pain for approximately 1 week.  Yesterday started having mid sternal chest pain described as pressure. Reports + short of breath and nausea.

## 2017-03-18 NOTE — ED Provider Notes (Signed)
[facility] Provider Note  ____________________________________________  Time seen: Approximately 9:01 PM  I have reviewed the triage vital signs and the nursing notes.   HISTORY  Chief Complaint Chest Pain    HPI Colin Glass is a 31 y.o. male a history of alcohol abuse and reflux presenting with chest pain.  The patient reports that for years, he has had a central chest pressure associated with an upwards burning into the neck, worse with burping and laying down.  the patient notes that this is worse when he binge drinks alcohol.  He generally takes to 150 mg tablets of Zantac in the morning and in the evening when he is having symptoms.  On Saturday, the patient had an episode of binge drinking, and then tonight he developed similar GERD symptoms, but they did not resolve with Zantac as they usually do.  He denies any associated shortness of breath, palpitations, lightheadedness or syncope, cough or cold symptoms, fever or chills.  He denies cocaine use.  Past Medical History:  Diagnosis Date  . GERD (gastroesophageal reflux disease)     There are no active problems to display for this patient.   Past Surgical History:  Procedure Laterality Date  . DENTAL SURGERY    . MOUTH SURGERY      Current Outpatient Rx  . Order #: [id number] Class: Historical Med  . Order #: [id number] Class: Print  . Order #: [id number] Class: Print  . Order #: [id number] Class: Print  . Order #: [id number] Class: Print  . Order #: [id number] Class: Print  . Order #: [id number] Class: Print  . Order #: [id number] Class: Print  . Order #: [id number] Class: Print  . Order #: [id number] Class: Print    Allergies Patient has no known allergies.  No family history on file.  Social History Social History   Tobacco Use  . Smoking status: Current Every Day Smoker    Packs/day: 0.50    Types: Cigarettes    Last attempt to quit: 10/21/2014    Years since quitting: 2.4   . Smokeless tobacco: Never Used  Substance Use Topics  . Alcohol use: Yes  . Drug use: Yes    Types: Marijuana    Review of Systems Constitutional: No fever/chills.  No lightheadedness or syncope. Eyes: No visual changes. ENT: No sore throat. No congestion or rhinorrhea. Cardiovascular: Positive chest pain. Denies palpitations. Respiratory: Denies shortness of breath.  No cough. Gastrointestinal: No abdominal pain.  No nausea, no vomiting.  No diarrhea.  No constipation.  Positive burping. Genitourinary: Negative for dysuria. Musculoskeletal: Negative for back pain. Skin: Negative for rash. Neurological: Negative for headaches. No focal numbness, tingling or weakness.     ____________________________________________   PHYSICAL EXAM:  VITAL SIGNS: ED Triage Vitals  Enc Vitals Group     BP 03/18/17 1955 117/73     Pulse Rate 03/18/17 1955 68     Resp 03/18/17 1955 18     Temp 03/18/17 1955 98 F (36.7 C)     Temp Source 03/18/17 1955 Oral     SpO2 03/18/17 1955 99 %     Weight 03/18/17 1949 180 lb (81.6 kg)     Height 03/18/17 1949 6\' 2"  (1.88 m)     Head Circumference --      Peak Flow --      Pain Score 03/18/17 1949 6     Pain Loc --      Pain Edu? --      Excl. in [place]? --  Constitutional: Alert and oriented. Well appearing and in no acute distress. Answers questions appropriately. Eyes: Conjunctivae are normal.  EOMI. No scleral icterus. Head: Atraumatic. Nose: No congestion/rhinnorhea. Mouth/Throat: Mucous membranes are moist.  Neck: No stridor.  Supple.  No JVD.  No meningismus. Cardiovascular: Normal rate, regular rhythm. No murmurs, rubs or gallops.  Respiratory: Normal respiratory effort.  No accessory muscle use or retractions. Lungs CTAB.  No wheezes, rales or ronchi. Gastrointestinal: Soft, nontender and nondistended.  No guarding or rebound.  No peritoneal signs. Musculoskeletal: No LE edema. No ttp in the calves or palpable cords.  Negative  Homan's sign. Neurologic:  A&Ox3.  Speech is clear.  Face and smile are symmetric.  EOMI.  Moves all extremities well. Skin:  Skin is warm, dry and intact. No rash noted. Psychiatric: Mood and affect are normal. Speech and behavior are normal.  Normal judgement.  ____________________________________________   LABS (all labs ordered are listed, but only abnormal results are displayed)  Labs Reviewed  BASIC METABOLIC PANEL - Abnormal; Notable for the following components:      Result Value   Glucose, Bld 102 (*)    All other components within normal limits  CBC  TROPONIN I   ____________________________________________  EKG  ED ECG REPORT I, Eula Listen, the attending physician, personally viewed and interpreted this ECG.   Date: 03/18/2017  EKG Time: 1950  Rate: 66  Rhythm: normal sinus rhythm  Axis: normal  Intervals:none  ST&T Change: Nonspecific T wave inversion in V1.  Early repolarization in V3 through V6.  This EKG is compared to 01/14/17 and is unchanged in morphology.  ____________________________________________  RADIOLOGY  Dg Chest 2 View  Result Date: 03/18/2017 CLINICAL DATA:  Right-sided chest pain EXAM: CHEST  2 VIEW COMPARISON:  Chest radiograph 07/22/2015 FINDINGS: The heart size and mediastinal contours are within normal limits. Both lungs are clear. The visualized skeletal structures are unremarkable. IMPRESSION: Normal chest. Electronically Signed   By: Ulyses Jarred M.D.   On: 03/18/2017 20:23    ____________________________________________   PROCEDURES  Procedure(s) performed: None  Procedures  Critical Care performed: No ____________________________________________   INITIAL IMPRESSION / ASSESSMENT AND PLAN / ED COURSE  Pertinent labs & imaging results that were available during my care of the patient were reviewed by me and considered in my medical decision making (see chart for details).  31 y.o. male with a history of GERD  presenting with chest pain.  Overall, the patient is hemodynamically stable and has no evidence of ACS or MI.  His cardiopulmonary exam is reassuring, his EKG does not show ischemic changes, and he has a negative troponin.  The patient's chest x-ray shows no acute cardiopulmonary process.  It is possible that the patient is having worsening GERD, especially in the setting of binge drinking alcohol this weekend.  Peptic ulcer disease is also possible.  In the past the patient has had alcoholic pancreatitis, but this is much less likely given no abdominal discomfort on my examination.  I do not see any evidence of free air that would be consistent with esophageal perforation.  At this time, we will plan to treat the patient with a GI cocktail, and have him continue his H2 blocker while adding a PPI to his GERD regimen.  I have talked to him about drinking in moderation, and close PMD follow-up.  He understands return precautions.  ____________________________________________  FINAL CLINICAL IMPRESSION(S) / ED DIAGNOSES  Final diagnoses:  Epigastric pain  Burping  Alcohol consumption  binge drinking         NEW MEDICATIONS STARTED DURING THIS VISIT:  This SmartLink is deprecated. Use AVSMEDLIST instead to display the medication list for a patient.    Eula Listen, MD 03/18/17 2105

## 2017-06-26 ENCOUNTER — Emergency Department: Payer: Self-pay

## 2017-06-26 ENCOUNTER — Other Ambulatory Visit: Payer: Self-pay

## 2017-06-26 ENCOUNTER — Emergency Department
Admission: EM | Admit: 2017-06-26 | Discharge: 2017-06-26 | Disposition: A | Discharge status: Home / Self Care | Payer: Self-pay | Attending: Emergency Medicine | Admitting: Emergency Medicine

## 2017-06-26 ENCOUNTER — Encounter: Payer: Self-pay | Admitting: Emergency Medicine

## 2017-06-26 DIAGNOSIS — R1084 Generalized abdominal pain: Secondary | ICD-10-CM

## 2017-06-26 DIAGNOSIS — R1011 Right upper quadrant pain: Secondary | ICD-10-CM

## 2017-06-26 DIAGNOSIS — K5900 Constipation, unspecified: Secondary | ICD-10-CM | POA: Insufficient documentation

## 2017-06-26 DIAGNOSIS — F17210 Nicotine dependence, cigarettes, uncomplicated: Secondary | ICD-10-CM | POA: Insufficient documentation

## 2017-06-26 DIAGNOSIS — Z79899 Other long term (current) drug therapy: Secondary | ICD-10-CM | POA: Insufficient documentation

## 2017-06-26 LAB — URINALYSIS, COMPLETE (UACMP) WITH MICROSCOPIC
Bacteria, UA: NONE SEEN
Bilirubin Urine: NEGATIVE
Glucose, UA: NEGATIVE mg/dL
Hgb urine dipstick: NEGATIVE
KETONES UR: NEGATIVE mg/dL
Leukocytes, UA: NEGATIVE
Nitrite: NEGATIVE
PH: 6 (ref 5.0–8.0)
Protein, ur: NEGATIVE mg/dL
Specific Gravity, Urine: 1.023 (ref 1.005–1.030)

## 2017-06-26 LAB — COMPREHENSIVE METABOLIC PANEL
ALBUMIN: 4.2 g/dL (ref 3.5–5.0)
ALT: 33 U/L (ref 17–63)
AST: 29 U/L (ref 15–41)
Alkaline Phosphatase: 59 U/L (ref 38–126)
Anion gap: 8 (ref 5–15)
BUN: 9 mg/dL (ref 6–20)
CHLORIDE: 104 mmol/L (ref 101–111)
CO2: 26 mmol/L (ref 22–32)
CREATININE: 0.93 mg/dL (ref 0.61–1.24)
Calcium: 8.8 mg/dL — ABNORMAL LOW (ref 8.9–10.3)
GFR calc Af Amer: >60 mL/min (ref >60)
GFR calc non Af Amer: >60 mL/min (ref >60)
GLUCOSE: 100 mg/dL — AB (ref 65–99)
POTASSIUM: 3.9 mmol/L (ref 3.5–5.1)
Sodium: 138 mmol/L (ref 135–145)
Total Bilirubin: 1.7 mg/dL — ABNORMAL HIGH (ref 0.3–1.2)
Total Protein: 7.1 g/dL (ref 6.5–8.1)

## 2017-06-26 LAB — TROPONIN I: Troponin I: <0.03 ng/mL (ref <0.03)

## 2017-06-26 LAB — CBC WITH DIFFERENTIAL/PLATELET
Basophils Absolute: 0 10*3/uL (ref 0–0.1)
Basophils Relative: 0 %
EOS ABS: 0.2 10*3/uL (ref 0–0.7)
EOS PCT: 3 %
HCT: 48.7 % (ref 40.0–52.0)
Hemoglobin: 15.8 g/dL (ref 13.0–18.0)
LYMPHS ABS: 1.9 10*3/uL (ref 1.0–3.6)
LYMPHS PCT: 30 %
MCH: 28.3 pg (ref 26.0–34.0)
MCHC: 32.5 g/dL (ref 32.0–36.0)
MCV: 87.2 fL (ref 80.0–100.0)
MONO ABS: 0.5 10*3/uL (ref 0.2–1.0)
MONOS PCT: 8 %
Neutro Abs: 3.9 10*3/uL (ref 1.4–6.5)
Neutrophils Relative %: 59 %
PLATELETS: 227 10*3/uL (ref 150–440)
RBC: 5.58 MIL/uL (ref 4.40–5.90)
RDW: 14.6 % — AB (ref 11.5–14.5)
WBC: 6.5 10*3/uL (ref 3.8–10.6)

## 2017-06-26 LAB — LIPASE, BLOOD: LIPASE: 40 U/L (ref 11–51)

## 2017-06-26 NOTE — ED Notes (Signed)
Patient transported to X-ray 

## 2017-06-26 NOTE — Discharge Instructions (Addendum)
You are evaluated for abdominal discomfort, and although no certain cause was found, your exam and evaluation are overall reassuring in the emerge department today.  Return to the emergency room immediately for any black or bloody stool, worsening or uncontrolled pain, fever, vomiting, or any other symptoms concerning to you.  We discussed you may try taking probiotic supplement.  You may try taking over-the-counter constipation medications such as stool softener called Colace or MiraLAX for constipation.  I have recommended he follow-up with primary care doctor as well as a gastroenterologist for possible irritable bowel syndrome and further work up and investigation/

## 2017-06-26 NOTE — ED Notes (Signed)
Patient transported to Ultrasound 

## 2017-06-26 NOTE — ED Provider Notes (Signed)
[facility] Emergency Department Provider Note ____________________________________________   I have reviewed the triage vital signs and the triage nursing note.  HISTORY  Chief Complaint Abdominal Pain   Historian Patient  HPI Colin Glass is a 32 y.o. male with a history of GERD, presents stating he has had several months of intermittent abdominal pain.  He states it often is in the upper abdomen but occasionally goes on to the lower abdomen.  He states that the last 24 hours has been worse which is why came in today.  He states he has issues with constipation.  No black or bloody stools.  Occasional nausea, no vomiting.  Pain was moderate to severe worse over the last 24 hours which is why came in for evaluation.  No chest pain or trouble breathing or coughing.  No fevers.  No urinary symptoms.  Does not seem to be associated with food.  He does occasionally have burping and belching.  He takes Zantac over-the-counter for history of GERD.     Past Medical History:  Diagnosis Date  . GERD (gastroesophageal reflux disease)     There are no active problems to display for this patient.   Past Surgical History:  Procedure Laterality Date  . DENTAL SURGERY    . MOUTH SURGERY      Prior to Admission medications   Medication Sig Start Date End Date Taking? Authorizing Provider  ranitidine (ZANTAC) 150 MG tablet Take 1 tablet (150 mg total) by mouth 2 (two) times daily. 09/24/15  Yes Beers, Pierce Crane, PA-C  albuterol (PROVENTIL HFA;VENTOLIN HFA) 108 (90 BASE) MCG/ACT inhaler Inhale 1-2 puffs into the lungs every 6 (six) hours as needed for wheezing or shortness of breath.    [provider]  cyclobenzaprine (FLEXERIL) 10 MG tablet Take 1 tablet (10 mg total) by mouth 3 (three) times daily as needed for muscle spasms. Patient not taking: Reported on 01/15/2017 08/10/16   Merlyn Lot, MD  HYDROcodone-acetaminophen (NORCO/VICODIN) 5-325 MG tablet  Take 1 tablet by mouth every 4 (four) hours as needed for moderate pain. Patient not taking: Reported on 01/15/2017 11/03/16 11/03/17  Sherrie George B, FNP  naproxen (NAPROSYN) 500 MG tablet Take 1 tablet (500 mg total) by mouth 2 (two) times daily with a meal. Patient not taking: Reported on 01/15/2017 11/03/16   Victorino Dike, FNP  omeprazole (PRILOSEC) 40 MG capsule Take 1 capsule (40 mg total) daily by mouth. Patient not taking: Reported on 06/26/2017 03/18/17 03/18/18  Eula Listen, MD  ondansetron (ZOFRAN ODT) 4 MG disintegrating tablet Take 1 tablet (4 mg total) by mouth every 8 (eight) hours as needed for nausea or vomiting. Patient not taking: Reported on 06/26/2017 01/15/17   Gregor Hams, MD  oxyCODONE-acetaminophen (ROXICET) 5-325 MG tablet Take 1 tablet by mouth every 4 (four) hours as needed for severe pain. Patient not taking: Reported on 06/26/2017 01/15/17   Gregor Hams, MD  sucralfate (CARAFATE) 1 g tablet Take 1 tablet (1 g total) by mouth 4 (four) times daily. 11/10/15 11/09/16  Schaevitz, Randall An, MD  traMADol (ULTRAM) 50 MG tablet Take 1 tablet (50 mg total) by mouth every 6 (six) hours as needed. Patient not taking: Reported on 01/15/2017 08/10/16 08/10/17  Merlyn Lot, MD  lansoprazole (PREVACID) 30 MG capsule Take 1 capsule (30 mg total) by mouth daily at 12 noon. 11/04/14 09/24/15  Evalee Jefferson, PA-C    No Known Allergies  History reviewed. No pertinent family history.  Social History Social History   Tobacco Use  . Smoking status: Current Every Day Smoker    Packs/day: 0.50    Types: Cigarettes    Last attempt to quit: 10/21/2014    Years since quitting: 2.6  . Smokeless tobacco: Never Used  Substance Use Topics  . Alcohol use: Yes  . Drug use: Yes    Types: Marijuana    Review of Systems  Constitutional: Negative for fever. Eyes: Negative for visual changes. ENT: Negative for sore throat. Cardiovascular: Negative for chest  pain. Respiratory: Negative for shortness of breath. Gastrointestinal: Negative for vomiting and diarrhea. Genitourinary: Negative for dysuria. Musculoskeletal: Negative for back pain. Skin: Negative for rash. Neurological: Negative for headache.  ____________________________________________   PHYSICAL EXAM:  VITAL SIGNS: ED Triage Vitals [06/26/17 0743]  Enc Vitals Group     BP 118/77     Pulse Rate 80     Resp 16     Temp 97.7 F (36.5 C)     Temp Source Oral     SpO2 98 %     Weight 183 lb (83 kg)     Height 6\' 2"  (1.88 m)     Head Circumference      Peak Flow      Pain Score 8     Pain Loc      Pain Edu?      Excl. in [place]?      Constitutional: Alert and oriented. Well appearing and in no distress. HEENT   Head: Normocephalic and atraumatic.      Eyes: Conjunctivae are normal. Pupils equal and round.       Ears:         Nose: No congestion/rhinnorhea.   Mouth/Throat: Mucous membranes are moist.   Neck: No stridor. Cardiovascular/Chest: Normal rate, regular rhythm.  No murmurs, rubs, or gallops. Respiratory: Normal respiratory effort without tachypnea nor retractions. Breath sounds are clear and equal bilaterally. No wheezes/rales/rhonchi. Gastrointestinal: Soft. No distention, no guarding, no rebound.  Mild tenderness in the mid abdomen epigastrium and right upper quadrant.  No focal McBurney's point tenderness or significant lower abdominal tenderness. Genitourinary/rectal:Deferred Musculoskeletal: Nontender with normal range of motion in all extremities. No joint effusions.  No lower extremity tenderness.  No edema. Neurologic:  Normal speech and language. No gross or focal neurologic deficits are appreciated. Skin:  Skin is warm, dry and intact. No rash noted. Psychiatric: Mood and affect are normal. Speech and behavior are normal. Patient exhibits appropriate insight and judgment.   ____________________________________________  LABS (pertinent  positives/negatives) I, Lisa Roca, MD the attending physician have reviewed the labs noted below.  Labs Reviewed  COMPREHENSIVE METABOLIC PANEL - Abnormal; Notable for the following components:      Result Value   Glucose, Bld 100 (*)    Calcium 8.8 (*)    Total Bilirubin 1.7 (*)    All other components within normal limits  CBC WITH DIFFERENTIAL/PLATELET - Abnormal; Notable for the following components:   RDW 14.6 (*)    All other components within normal limits  URINALYSIS, COMPLETE (UACMP) WITH MICROSCOPIC - Abnormal; Notable for the following components:   Color, Urine YELLOW (*)    APPearance CLEAR (*)    Squamous Epithelial / LPF 0-5 (*)    All other components within normal limits  LIPASE, BLOOD  TROPONIN I    ____________________________________________    EKG I, Lisa Roca, MD, the attending physician have personally viewed and interpreted all ECGs.  66 beats from  appear normal sinus rhythm.  Narrow QRS.  Normal axis.  Nonspecific ST and T wave.  J-point elevation in V4 and V5 ____________________________________________  RADIOLOGY  Radiologist report Right upper quadrant ultrasound:  CLINICAL DATA: Right upper quadrant pain  EXAM: ULTRASOUND ABDOMEN LIMITED RIGHT UPPER QUADRANT  COMPARISON: Ultrasound 01/15/2017  FINDINGS: Gallbladder:  No gallstones or wall thickening visualized. No sonographic Murphy sign noted by sonographer.  Common bile duct:  Diameter: 4.6 mm  Liver:  No focal lesion identified. Within normal limits in parenchymal echogenicity. Portal vein is patent on color Doppler imaging with normal direction of blood flow towards the liver.  IMPRESSION: Negative right upper quadrant ultrasound.   Viewed and interpreted by myself : x-ray abdomen with chest: Nonspecific bowel gas pattern.  Clear chest Radiologist report:IMPRESSION: Fairly diffuse stool throughout colon. No bowel obstruction or free air evident. Lungs  clear. __________________________________________  PROCEDURES  Procedure(s) performed: None  Critical Care performed: None   ____________________________________________  ED COURSE / ASSESSMENT AND PLAN  Pertinent labs & imaging results that were available during my care of the patient were reviewed by me and considered in my medical decision making (see chart for details).    Patient is overall well-appearing, really gives a history of 4 months of intermittent symptoms, intermittent moving abdominal pain as well as constipation symptoms.  We discussed possibility of irritable bowel.  On clinical exam does not seem to be in the location for acute appendicitis, not highly suspicious for other since infectious process of the abdomen, I am not recommending CT at this point time.  Ultrasound is reassuring.  X-rays are overall reassuring, and do show significant stool burden consistent with the patient's description of history of constipation.  We discussed symptomatic management, and follow-up plan as well as return precautions.  DIFFERENTIAL DIAGNOSIS: Differential diagnosis includes, but is not limited to, biliary disease (biliary colic, acute cholecystitis, cholangitis, choledocholithiasis, etc), intrathoracic causes for epigastric abdominal pain including ACS, gastritis, duodenitis, pancreatitis, small bowel or large bowel obstruction, abdominal aortic aneurysm, hernia, and gastritis.  Differential diagnosis includes, but is not limited to, acute appendicitis, renal colic, testicular torsion, urinary tract infection/pyelonephritis, prostatitis,  epididymitis, diverticulitis, small bowel obstruction or ileus, colitis, abdominal aortic aneurysm, gastroenteritis, hernia, etc.   CONSULTATIONS:  None   Patient / Family / Caregiver informed of clinical course, medical decision-making process, and agree with plan.   I discussed return precautions, follow-up instructions, and  discharge instructions with patient and/or family.  Discharge Instructions :  You are evaluated for abdominal discomfort, and although no certain cause was found, your exam and evaluation are overall reassuring in the emerge department today.  Return to the emergency room immediately for any black or bloody stool, worsening or uncontrolled pain, fever, vomiting, or any other symptoms concerning to you.  We discussed you may try taking probiotic supplement.  You may try taking over-the-counter constipation medications such as stool softener called Colace or MiraLAX for constipation.  I have recommended he follow-up with primary care doctor as well as a gastroenterologist for possible irritable bowel syndrome and further work up and investigation/    ___________________________________________   FINAL CLINICAL IMPRESSION(S) / ED DIAGNOSES   Final diagnoses:  RUQ pain  Constipation, unspecified constipation type  Generalized abdominal pain      ___________________________________________        Note: This dictation was prepared with Dragon dictation. Any transcriptional errors that result from this process are unintentional    Lisa Roca, MD 06/26/17 1214

## 2017-06-26 NOTE — ED Triage Notes (Signed)
Pt c/o generalized abdominal pain without NVD or fevers.  Ambulatory. NAD.  VSS.

## 2017-10-21 ENCOUNTER — Encounter: Payer: Self-pay | Admitting: Emergency Medicine

## 2017-10-21 ENCOUNTER — Emergency Department
Admission: EM | Admit: 2017-10-21 | Discharge: 2017-10-21 | Disposition: A | Discharge status: Home / Self Care | Payer: Self-pay | Attending: Emergency Medicine | Admitting: Emergency Medicine

## 2017-10-21 DIAGNOSIS — F17210 Nicotine dependence, cigarettes, uncomplicated: Secondary | ICD-10-CM | POA: Insufficient documentation

## 2017-10-21 DIAGNOSIS — J40 Bronchitis, not specified as acute or chronic: Secondary | ICD-10-CM | POA: Insufficient documentation

## 2017-10-21 MED ORDER — BENZONATATE 100 MG PO CAPS: ORAL_CAPSULE | ORAL | 0 refills | Status: DC

## 2017-10-21 MED ORDER — DEXAMETHASONE SODIUM PHOSPHATE 10 MG/ML IJ SOLN
10.0000 mg | Freq: Once | INTRAMUSCULAR | Status: AC
  Administered 2017-10-21: 10 mg via INTRAMUSCULAR
  Filled 2017-10-21: qty 1

## 2017-10-21 MED ORDER — PREDNISONE 10 MG PO TABS: 10.0000 mg | ORAL_TABLET | Freq: Two times a day (BID) | ORAL | 0 refills | Status: DC

## 2017-10-21 MED ORDER — ALBUTEROL SULFATE HFA 108 (90 BASE) MCG/ACT IN AERS: 2.0000 | INHALATION_SPRAY | Freq: Four times a day (QID) | RESPIRATORY_TRACT | 0 refills | Status: AC | PRN

## 2017-10-21 MED ORDER — FLUTICASONE PROPIONATE 50 MCG/ACT NA SUSP: 2.0000 | Freq: Every day | NASAL | 0 refills | Status: AC

## 2017-10-21 NOTE — ED Notes (Addendum)
See triage note  Presents with cough and sore throat for couple of weeks.  States cough has been prod at times. Sore throat and intermittent fever for about 3 days  Fever has been subjective  Afebrile on arrival

## 2017-10-21 NOTE — ED Provider Notes (Signed)
[facility] Emergency Department Provider Note ____________________________________________  Time seen: 1030  I have reviewed the triage vital signs and the nursing notes.  HISTORY  Chief Complaint  Sore Throat; Fever; and Cough  History as told to Su Monks, PA-S Tyler Deis)   HPI Colin Glass is a 32 y.o. male presents himself to the ED for evaluation of a 2-week complaint of intermittent cough and sore throat.  Patient describes a intermittently productive cough producing green sputum at times.  He also notes a sore throat has been present for the last 3 days.  He describes intermittent, subjective fevers over the last 3 days as well.  He has taken Mucinex for symptom relief in the interim.  He denies any chest pain, shortness breath, or wheezing.  Past Medical History:  Diagnosis Date  . GERD (gastroesophageal reflux disease)     There are no active problems to display for this patient.   Past Surgical History:  Procedure Laterality Date  . DENTAL SURGERY    . MOUTH SURGERY      Prior to Admission medications   Medication Sig Start Date End Date Taking? Authorizing Provider  albuterol (PROVENTIL HFA;VENTOLIN HFA) 108 (90 BASE) MCG/ACT inhaler Inhale 1-2 puffs into the lungs every 6 (six) hours as needed for wheezing or shortness of breath.    [provider]  albuterol (PROVENTIL HFA;VENTOLIN HFA) 108 (90 Base) MCG/ACT inhaler Inhale 2 puffs into the lungs every 6 (six) hours as needed for wheezing or shortness of breath. 10/21/17   Kymberli Wiegand, Dannielle Karvonen, PA-C  benzonatate (TESSALON PERLES) 100 MG capsule Take 1-2 tabs TID prn cough 10/21/17   Adib Wahba, Dannielle Karvonen, PA-C  fluticasone (FLONASE) 50 MCG/ACT nasal spray Place 2 sprays into both nostrils daily. 10/21/17   Twanda Stakes, Dannielle Karvonen, PA-C  predniSONE (DELTASONE) 10 MG tablet Take 1 tablet (10 mg total) by mouth 2 (two) times daily with a meal. 10/21/17   Breylon Sherrow, Dannielle Karvonen, PA-C     Allergies Patient has no known allergies.  History reviewed. No pertinent family history.  Social History Social History   Tobacco Use  . Smoking status: Current Every Day Smoker    Packs/day: 0.50    Types: Cigarettes    Last attempt to quit: 10/21/2014    Years since quitting: 3.0  . Smokeless tobacco: Never Used  Substance Use Topics  . Alcohol use: Yes  . Drug use: Yes    Types: Marijuana    Review of Systems  Constitutional: Positive for subjective fever. Eyes: Negative for visual changes. ENT: Positive for sore throat. Cardiovascular: Negative for chest pain. Respiratory: Negative for shortness of breath.  Reports intermittently productive cough. Gastrointestinal: Negative for abdominal pain, vomiting and diarrhea. Musculoskeletal: Negative for back pain. ____________________________________________  PHYSICAL EXAM:  VITAL SIGNS: ED Triage Vitals  Enc Vitals Group     BP 10/21/17 1011 (!) 133/118     Pulse Rate 10/21/17 1011 79     Resp 10/21/17 1011 20     Temp 10/21/17 1011 97.8 F (36.6 C)     Temp Source 10/21/17 1011 Oral     SpO2 10/21/17 1011 100 %     Weight 10/21/17 1012 185 lb (83.9 kg)     Height 10/21/17 1012 6\' 2"  (1.88 m)     Head Circumference --      Peak Flow --      Pain Score 10/21/17 1012 0     Pain Loc --  Pain Edu? --      Excl. in [place]? --     Constitutional: Alert and oriented. Well appearing and in no distress. Head: Normocephalic and atraumatic. No facial sinus tenderness Eyes: Conjunctivae are normal. PERRL. Normal extraocular movements Ears: Canals clear. TMs intact bilaterally. Nose: No congestion/rhinorrhea/epistaxis.  Turbinates are enlarged and erythematous. Mouth/Throat: Mucous membranes are moist.  Uvula is midline and tonsils are flat.  No oropharyngeal lesions are noted. Neck: Supple. No thyromegaly. Hematological/Lymphatic/Immunological: No cervical lymphadenopathy. Cardiovascular: Normal rate, regular  rhythm. Normal distal pulses. Respiratory: Normal respiratory effort. No wheezes/rales/rhonchi. Skin:  Skin is warm, dry and intact. No rash noted. ____________________________________________  PROCEDURES  Procedures Decadron 10 mg IM ____________________________________________  INITIAL IMPRESSION / ASSESSMENT AND PLAN / ED COURSE  Patient with a ED evaluation of 2 to 3-week complaint of intermittent cough, congestion, and sore throat.  Patient's exam is overall benign with symptoms likely representing a mild bronchitis.  Symptoms are likely from a viral or allergic etiology.  Patient will be discharged with prescriptions for Flonase, Tessalon Perles, albuterol, and prednisone.  He is advised to restart his daily allergy medicine for symptom relief.  He will follow-up with 1 of the local community clinics or return to the ED as needed. ____________________________________________  FINAL CLINICAL IMPRESSION(S) / ED DIAGNOSES  Final diagnoses:  Bronchitis      Carmie End, Dannielle Karvonen, PA-C 10/21/17 1114    Lavonia Drafts, MD 10/21/17 1426

## 2017-10-21 NOTE — Discharge Instructions (Signed)
Your symptoms seem to be consistent with a bronchitis, likely viral or allergic. Restart your daily allergy medicine. Take the prescription meds as directed. Consider taking Benadryl at bedtime. Follow-up with [facility] or return as needed.

## 2017-10-21 NOTE — ED Triage Notes (Signed)
Pt reports productive cough with green phlem, sore throat and fevers for the past 2 weeks.

## 2017-11-20 ENCOUNTER — Other Ambulatory Visit: Payer: Self-pay

## 2017-11-20 ENCOUNTER — Emergency Department
Admission: EM | Admit: 2017-11-20 | Discharge: 2017-11-20 | Disposition: A | Discharge status: Home / Self Care | Payer: Self-pay | Attending: Emergency Medicine | Admitting: Emergency Medicine

## 2017-11-20 ENCOUNTER — Emergency Department: Payer: Self-pay

## 2017-11-20 ENCOUNTER — Encounter: Payer: Self-pay | Admitting: Emergency Medicine

## 2017-11-20 DIAGNOSIS — F17210 Nicotine dependence, cigarettes, uncomplicated: Secondary | ICD-10-CM | POA: Insufficient documentation

## 2017-11-20 DIAGNOSIS — Z79899 Other long term (current) drug therapy: Secondary | ICD-10-CM | POA: Insufficient documentation

## 2017-11-20 DIAGNOSIS — M47896 Other spondylosis, lumbar region: Secondary | ICD-10-CM

## 2017-11-20 DIAGNOSIS — M47817 Spondylosis without myelopathy or radiculopathy, lumbosacral region: Secondary | ICD-10-CM | POA: Insufficient documentation

## 2017-11-20 MED ORDER — MELOXICAM 15 MG PO TABS: 15.0000 mg | ORAL_TABLET | Freq: Every day | ORAL | 0 refills | Status: DC

## 2017-11-20 NOTE — ED Triage Notes (Signed)
Pt reports lower back pain. Pt states has has lower back pain for several years but has not had it checked out. Denies all other sx's including obvious injuries or urinary sx's.

## 2017-11-20 NOTE — ED Provider Notes (Signed)
[facility] Emergency Department Provider Note   ____________________________________________   First MD Initiated Contact with Patient 11/20/17 0912     (approximate)  I have reviewed the triage vital signs and the nursing notes.   HISTORY  Chief Complaint Back Pain    HPI Colin Glass is a 32 y.o. male patient complain of acute non-provocative back pain upon a.m. awakening.  Patient denies radicular component to his back pain.  Patient has bladder bowel dysfunction.  Patient state for several years he has had intermittent back pain but has not had it evaluated by orthopedics.  Patient rates the pain as a 9/10.  Patient described the pain is "aching".  No palliative measures for complaint.  Patient requesting excuse from work and for court appearance secondary to being seen in the ED today.  Past Medical History:  Diagnosis Date  . GERD (gastroesophageal reflux disease)     There are no active problems to display for this patient.   Past Surgical History:  Procedure Laterality Date  . DENTAL SURGERY    . MOUTH SURGERY      Prior to Admission medications   Medication Sig Start Date End Date Taking? Authorizing Provider  ranitidine (ZANTAC) 150 MG tablet Take 150 mg by mouth 2 (two) times daily.   Yes [provider]  albuterol (PROVENTIL HFA;VENTOLIN HFA) 108 (90 BASE) MCG/ACT inhaler Inhale 1-2 puffs into the lungs every 6 (six) hours as needed for wheezing or shortness of breath.    [provider]  albuterol (PROVENTIL HFA;VENTOLIN HFA) 108 (90 Base) MCG/ACT inhaler Inhale 2 puffs into the lungs every 6 (six) hours as needed for wheezing or shortness of breath. 10/21/17   Menshew, Dannielle Karvonen, PA-C  benzonatate (TESSALON PERLES) 100 MG capsule Take 1-2 tabs TID prn cough 10/21/17   Menshew, Dannielle Karvonen, PA-C  fluticasone (FLONASE) 50 MCG/ACT nasal spray Place 2 sprays into both nostrils daily. 10/21/17   Menshew, Dannielle Karvonen, PA-C  meloxicam (MOBIC) 15 MG tablet Take 1 tablet (15 mg total) by mouth daily. 11/20/17   Sable Feil, PA-C  predniSONE (DELTASONE) 10 MG tablet Take 1 tablet (10 mg total) by mouth 2 (two) times daily with a meal. 10/21/17   Menshew, Dannielle Karvonen, PA-C    Allergies Patient has no known allergies.  No family history on file.  Social History Social History   Tobacco Use  . Smoking status: Current Every Day Smoker    Packs/day: 0.50    Types: Cigarettes    Last attempt to quit: 10/21/2014    Years since quitting: 3.0  . Smokeless tobacco: Never Used  Substance Use Topics  . Alcohol use: Yes  . Drug use: Yes    Types: Marijuana    Review of Systems Constitutional: No fever/chills Eyes: No visual changes. ENT: No sore throat. Cardiovascular: Denies chest pain. Respiratory: Denies shortness of breath. Gastrointestinal: No abdominal pain.  No nausea, no vomiting.  No diarrhea.  No constipation. Genitourinary: Negative for dysuria. Musculoskeletal: chronic back pain. Skin: Negative for rash. Neurological: Negative for headaches, focal weakness or numbness.   ____________________________________________   PHYSICAL EXAM:  VITAL SIGNS: ED Triage Vitals  Enc Vitals Group     BP 11/20/17 0847 109/80     Pulse Rate 11/20/17 0847 81     Resp 11/20/17 0847 20     Temp 11/20/17 0847 98.2 F (36.8 C)     Temp Source 11/20/17 0847 Oral  SpO2 11/20/17 0847 97 %     Weight 11/20/17 0848 184 lb (83.5 kg)     Height 11/20/17 0848 6\' 2"  (1.88 m)     Head Circumference --      Peak Flow --      Pain Score 11/20/17 0848 9     Pain Loc --      Pain Edu? --      Excl. in [place]? --    Constitutional: Alert and oriented. Well appearing and in no acute distress. Cardiovascular: Normal rate, regular rhythm. Grossly normal heart sounds.  Good peripheral circulation. Respiratory: Normal respiratory effort.  No retractions. Lungs CTAB. Musculoskeletal: No obvious  deformity.  No CVA guarding.  Decreased range of motion with flexion left lateral movements.  Patient has negative straight leg test while supine. Neurologic:  Normal speech and language. No gross focal neurologic deficits are appreciated. No gait instability. Skin:  Skin is warm, dry and intact. No rash noted. Psychiatric: Mood and affect are normal. Speech and behavior are normal.  ____________________________________________   LABS (all labs ordered are listed, but only abnormal results are displayed)  Labs Reviewed - No data to display ____________________________________________  EKG   ____________________________________________  RADIOLOGY  Mild degenerative disc disease noted on lumbar spine x-ray.  Official radiology report(s): Dg Lumbar Spine 2-3 Views  Result Date: 11/20/2017 CLINICAL DATA:  Low back pain over the last 2 years, worse with standing. EXAM: LUMBAR SPINE - 2-3 VIEW COMPARISON:  None. FINDINGS: Five lumbar type vertebral bodies. Straightening of the normal lumbar lordosis. Disc space narrowing worse at L5-S1 than L4-5. Mild facet hypertrophy at L5-S1. Sacroiliac joints show mild osteoarthritis on the left. IMPRESSION: Lower lumbar degenerative disc disease and degenerative facet disease, most pronounced at L5-S1. Electronically Signed   By: Nelson Chimes M.D.   On: 11/20/2017 10:02    ____________________________________________   PROCEDURES  Procedure(s) performed: None  Procedures  Critical Care performed: No  ____________________________________________   INITIAL IMPRESSION / ASSESSMENT AND PLAN / ED COURSE  As part of my medical decision making, I reviewed the following data within the electronic MEDICAL RECORD NUMBER   Back pain secondary to mild degenerative disc disease.  Discussed x-ray findings with patient.  Patient given discharge care instruction and prescription for meloxicam.  Patient [facility]       ____________________________________________   FINAL CLINICAL IMPRESSION(S) / ED DIAGNOSES  Final diagnoses:  Other osteoarthritis of spine, lumbar region     ED Discharge Orders        Ordered    meloxicam (MOBIC) 15 MG tablet  Daily     11/20/17 1042       Note:  This document was prepared using Dragon voice recognition software and may include unintentional dictation errors.    Sable Feil, PA-C 11/20/17 1045    Delman Kitten, MD 11/20/17 1424

## 2017-11-20 NOTE — ED Notes (Signed)
Patient says back pain for years, but last night got really bad and he could hardly stand.  Says just middle of back without radiation.  Pt in nad,

## 2018-05-16 ENCOUNTER — Emergency Department (HOSPITAL_COMMUNITY)
Admission: EM | Admit: 2018-05-16 | Discharge: 2018-05-17 | Discharge status: Against Medical Advice | Payer: Self-pay | Attending: Emergency Medicine | Admitting: Emergency Medicine

## 2018-05-16 ENCOUNTER — Encounter (HOSPITAL_COMMUNITY): Payer: Self-pay

## 2018-05-16 ENCOUNTER — Emergency Department (HOSPITAL_COMMUNITY): Payer: Self-pay

## 2018-05-16 DIAGNOSIS — F17210 Nicotine dependence, cigarettes, uncomplicated: Secondary | ICD-10-CM | POA: Insufficient documentation

## 2018-05-16 DIAGNOSIS — Z79899 Other long term (current) drug therapy: Secondary | ICD-10-CM | POA: Insufficient documentation

## 2018-05-16 DIAGNOSIS — R079 Chest pain, unspecified: Secondary | ICD-10-CM | POA: Insufficient documentation

## 2018-05-16 LAB — CBC
HCT: 49.7 % (ref 39.0–52.0)
HEMOGLOBIN: 16.3 g/dL (ref 13.0–17.0)
MCH: 28.1 pg (ref 26.0–34.0)
MCHC: 32.8 g/dL (ref 30.0–36.0)
MCV: 85.5 fL (ref 80.0–100.0)
Platelets: 228 10*3/uL (ref 150–400)
RBC: 5.81 MIL/uL (ref 4.22–5.81)
RDW: 14 % (ref 11.5–15.5)
WBC: 6.7 10*3/uL (ref 4.0–10.5)
nRBC: 0 % (ref 0.0–0.2)

## 2018-05-16 LAB — BASIC METABOLIC PANEL
ANION GAP: 9 (ref 5–15)
BUN: 10 mg/dL (ref 6–20)
CALCIUM: 9.8 mg/dL (ref 8.9–10.3)
CO2: 26 mmol/L (ref 22–32)
Chloride: 105 mmol/L (ref 98–111)
Creatinine, Ser: 1.12 mg/dL (ref 0.61–1.24)
GFR calc Af Amer: >60 mL/min (ref >60)
GLUCOSE: 101 mg/dL — AB (ref 70–99)
POTASSIUM: 4.4 mmol/L (ref 3.5–5.1)
SODIUM: 140 mmol/L (ref 135–145)

## 2018-05-16 LAB — I-STAT TROPONIN, ED: Troponin i, poc: 0 ng/mL (ref 0.00–0.08)

## 2018-05-16 MED ORDER — ALUM & MAG HYDROXIDE-SIMETH 200-200-20 MG/5ML PO SUSP
30.0000 mL | Freq: Once | ORAL | Status: DC
  Filled 2018-05-16: qty 30

## 2018-05-16 MED ORDER — LIDOCAINE VISCOUS HCL 2 % MT SOLN
15.0000 mL | Freq: Once | OROMUCOSAL | Status: DC
  Filled 2018-05-16: qty 15

## 2018-05-16 NOTE — ED Triage Notes (Signed)
Pt c/o central chest pain, acid reflux, and flu like symptoms for the last week with increasing intensity.

## 2018-05-16 NOTE — ED Provider Notes (Signed)
[place] EMERGENCY DEPARTMENT Provider Note   CSN: [id number] Arrival date & time: 05/16/18  1921     History   Chief Complaint Chief Complaint  Patient presents with  . Chest Pain    HPI Colin Glass is a 33 y.o. male.  The history is provided by the patient and medical records.     33 y.o. M with hx of GERD, presenting to the ED with chest pain.  States this has been ongoing for about a week now.  States midsternal in nature, some radiation to right upper chest occasionally.    Does report he started developing flulike symptoms about 5 days ago, mostly achiness in his back and extremities, cough, cold chills, and nausea.  He denies any shortness of breath, palpitations, dizziness, diaphoresis, vomiting, or diarrhea.  He is not had any noted fevers.  Does report some positive sick contacts, unsure if they had true influenza.  States he feels like his chest pain is likely due to acid reflux.  He has a longstanding history of this, recently stopped taking his Zantac because there was a recall on this medication.  He has not tried anything else for his symptoms.  Does report a generally poor diet, eats a lot of fast food.  Earlier today he ate a sausage biscuit, hashbrowns with hot sauce, and a honey bun from [place] which made his chest pain worse.  Past Medical History:  Diagnosis Date  . GERD (gastroesophageal reflux disease)     There are no active problems to display for this patient.   Past Surgical History:  Procedure Laterality Date  . DENTAL SURGERY    . MOUTH SURGERY          Home Medications    Prior to Admission medications   Medication Sig Start Date End Date Taking? Authorizing Provider  albuterol (PROVENTIL HFA;VENTOLIN HFA) 108 (90 BASE) MCG/ACT inhaler Inhale 1-2 puffs into the lungs every 6 (six) hours as needed for wheezing or shortness of breath.    [provider]  albuterol (PROVENTIL HFA;VENTOLIN HFA) 108 (90  Base) MCG/ACT inhaler Inhale 2 puffs into the lungs every 6 (six) hours as needed for wheezing or shortness of breath. 10/21/17   Menshew, Dannielle Karvonen, PA-C  benzonatate (TESSALON PERLES) 100 MG capsule Take 1-2 tabs TID prn cough 10/21/17   Menshew, Dannielle Karvonen, PA-C  fluticasone (FLONASE) 50 MCG/ACT nasal spray Place 2 sprays into both nostrils daily. 10/21/17   Menshew, Dannielle Karvonen, PA-C  meloxicam (MOBIC) 15 MG tablet Take 1 tablet (15 mg total) by mouth daily. 11/20/17   Sable Feil, PA-C  predniSONE (DELTASONE) 10 MG tablet Take 1 tablet (10 mg total) by mouth 2 (two) times daily with a meal. 10/21/17   Menshew, Dannielle Karvonen, PA-C  ranitidine (ZANTAC) 150 MG tablet Take 150 mg by mouth 2 (two) times daily.    [provider]    Family History History reviewed. No pertinent family history.  Social History Social History   Tobacco Use  . Smoking status: Current Every Day Smoker    Packs/day: 0.50    Types: Cigarettes    Last attempt to quit: 10/21/2014    Years since quitting: 3.5  . Smokeless tobacco: Never Used  Substance Use Topics  . Alcohol use: Yes  . Drug use: Yes    Types: Marijuana     Allergies   Patient has no known allergies.   Review of Systems  Review of Systems  Constitutional: Positive for chills.  Respiratory: Positive for cough.   Cardiovascular: Positive for chest pain.  Gastrointestinal: Positive for nausea.  Musculoskeletal: Positive for myalgias.  All other systems reviewed and are negative.    Physical Exam Updated Vital Signs BP 113/74   Pulse 66   Temp 98 F (36.7 C) (Oral)   Resp 19   Ht 6\' 2"  (1.88 m)   Wt 83.5 kg   SpO2 99%   BMI 23.62 kg/m   Physical Exam Vitals signs and nursing note reviewed.  Constitutional:      Appearance: He is well-developed.     Comments: Well appearing  HENT:     Head: Normocephalic and atraumatic.     Right Ear: Tympanic membrane and ear canal normal.     Left Ear: Tympanic  membrane and ear canal normal.     Nose: Nose normal.     Mouth/Throat:     Lips: Pink.     Mouth: Mucous membranes are moist. No oral lesions.     Pharynx: No pharyngeal swelling, oropharyngeal exudate or posterior oropharyngeal erythema.  Eyes:     Conjunctiva/sclera: Conjunctivae normal.     Pupils: Pupils are equal, round, and reactive to light.  Neck:     Musculoskeletal: Normal range of motion.  Cardiovascular:     Rate and Rhythm: Normal rate and regular rhythm.     Heart sounds: Normal heart sounds.  Pulmonary:     Effort: Pulmonary effort is normal.     Breath sounds: Normal breath sounds. No decreased breath sounds or wheezing.     Comments: Lungs clear bilaterally, no distress Chest:     Comments: Chest wall non-tender Abdominal:     General: Bowel sounds are normal.     Palpations: Abdomen is soft.  Musculoskeletal: Normal range of motion.  Skin:    General: Skin is warm and dry.  Neurological:     Mental Status: He is alert and oriented to person, place, and time.      ED Treatments / Results  Labs (all labs ordered are listed, but only abnormal results are displayed) Labs Reviewed  BASIC METABOLIC PANEL - Abnormal; Notable for the following components:      Result Value   Glucose, Bld 101 (*)    All other components within normal limits  CBC  I-STAT TROPONIN, ED    EKG None  Radiology Dg Chest 2 View  Result Date: 05/16/2018 CLINICAL DATA:  Chest pain EXAM: CHEST - 2 VIEW COMPARISON:  06/26/2017 FINDINGS: The heart size and mediastinal contours are within normal limits. Both lungs are clear. The visualized skeletal structures are unremarkable. IMPRESSION: No active cardiopulmonary disease. Electronically Signed   By: Donavan Foil M.D.   On: 05/16/2018 20:58    Procedures Procedures (including critical care time)  Medications Ordered in ED Medications - No data to display   Initial Impression / Assessment and Plan / ED Course  I have reviewed  the triage vital signs and the nursing notes.  Pertinent labs & imaging results that were available during my care of the patient were reviewed by me and considered in my medical decision making (see chart for details).  33 year old male here with chest pain.  This is been ongoing for about a week.  He describes it as "heartburn".  He does endorse eating a generally poor diet and recently stopped taking his Zantac as there was a recall on this medication.  Also has had  some cold-like symptoms.  He is afebrile and nontoxic in appearance.  Exam is benign, lungs are clear without any wheezes or rhonchi.  He is eating potato chips and drinking apple juice just prior to exam.  EKG is nonischemic.  Labs are reassuring, troponin negative.  Chest x-ray is clear.    Has had recent URI symptoms but does not appear to have any infectious findings on exam here.  Likely viral process.  In regards to chest pain, symptoms worse this morning after eating sausage biscuit, hashbrowns with hot sauce, etc.  This is likely due to uncontrolled GERD.  He does report almost daily symptoms of this.  Given GI cocktail here, would recommend starting daily Prilosec.    Myself and primary RN were in with another critical patient.  Fellow RN went to medicate patient and he was not in the room, nor his belongings.  He did not tell anyone that he was leaving.  He left without medications, discharge papers/instructions, or prescriptions.  Final Clinical Impressions(s) / ED Diagnoses   Final diagnoses:  Chest pain in adult    ED Discharge Orders    None       Larene Pickett, PA-C 05/17/18 [place], [place], DO 05/17/18 [phone]

## 2018-05-17 NOTE — ED Notes (Signed)
Assisting RN went into pt's room and could not find patient present. Pt left AMA

## 2018-05-17 NOTE — ED Notes (Signed)
In room to medicate patient.  Room empty.

## 2018-05-19 ENCOUNTER — Emergency Department (HOSPITAL_COMMUNITY): Payer: Self-pay

## 2018-05-19 ENCOUNTER — Emergency Department (HOSPITAL_COMMUNITY)
Admission: EM | Admit: 2018-05-19 | Discharge: 2018-05-19 | Disposition: A | Discharge status: Home / Self Care | Payer: Self-pay | Attending: Emergency Medicine | Admitting: Emergency Medicine

## 2018-05-19 ENCOUNTER — Encounter (HOSPITAL_COMMUNITY): Payer: Self-pay | Admitting: Emergency Medicine

## 2018-05-19 DIAGNOSIS — F17210 Nicotine dependence, cigarettes, uncomplicated: Secondary | ICD-10-CM | POA: Insufficient documentation

## 2018-05-19 DIAGNOSIS — Z79899 Other long term (current) drug therapy: Secondary | ICD-10-CM | POA: Insufficient documentation

## 2018-05-19 DIAGNOSIS — M79641 Pain in right hand: Secondary | ICD-10-CM | POA: Insufficient documentation

## 2018-05-19 DIAGNOSIS — M25531 Pain in right wrist: Secondary | ICD-10-CM | POA: Insufficient documentation

## 2018-05-19 MED ORDER — IBUPROFEN 400 MG PO TABS
600.0000 mg | ORAL_TABLET | Freq: Once | ORAL | Status: AC
  Administered 2018-05-19: 600 mg via ORAL
  Filled 2018-05-19: qty 1

## 2018-05-19 NOTE — ED Notes (Signed)
Patient transported to X-ray 

## 2018-05-19 NOTE — Discharge Instructions (Signed)
Thank you for allowing me to care for you today in the Emergency Department.   Your x-rays today were negative.  Wear the brace on your right wrist until you are able to move the wrist without a significant amount of pain.  Apply an ice pack for 15 to 20 minutes as frequently as needed.  You can take 600 mg of ibuprofen with food or 6 and 50 mg of Tylenol once every 6 hours or alternate between these 2 medications for pain control.  Follow-up with primary care if your pain does not start to improve within the next week.  If you do not have a primary care provider, call the number on your discharge paperwork to get established with 1.  Return to the emergency department if you develop new numbness in the right hand, have a new injury, if your fingertips turn blue, or other new, concerning symptoms.
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
## 2018-06-17 ENCOUNTER — Emergency Department (HOSPITAL_COMMUNITY)
Admission: EM | Admit: 2018-06-17 | Discharge: 2018-06-18 | Disposition: A | Discharge status: Home / Self Care | Payer: Self-pay | Attending: Emergency Medicine | Admitting: Emergency Medicine

## 2018-06-17 DIAGNOSIS — M79641 Pain in right hand: Secondary | ICD-10-CM | POA: Insufficient documentation

## 2018-06-17 DIAGNOSIS — F17210 Nicotine dependence, cigarettes, uncomplicated: Secondary | ICD-10-CM | POA: Insufficient documentation

## 2018-06-17 DIAGNOSIS — Z79899 Other long term (current) drug therapy: Secondary | ICD-10-CM | POA: Insufficient documentation

## 2018-06-17 DIAGNOSIS — K219 Gastro-esophageal reflux disease without esophagitis: Secondary | ICD-10-CM | POA: Insufficient documentation

## 2018-06-18 ENCOUNTER — Encounter (HOSPITAL_COMMUNITY): Payer: Self-pay | Admitting: Emergency Medicine

## 2018-06-18 ENCOUNTER — Other Ambulatory Visit: Payer: Self-pay

## 2018-06-18 ENCOUNTER — Emergency Department (HOSPITAL_COMMUNITY): Payer: Self-pay

## 2018-06-18 LAB — CBC
HCT: 45.1 % (ref 39.0–52.0)
Hemoglobin: 14.9 g/dL (ref 13.0–17.0)
MCH: 28 pg (ref 26.0–34.0)
MCHC: 33 g/dL (ref 30.0–36.0)
MCV: 84.6 fL (ref 80.0–100.0)
Platelets: 239 10*3/uL (ref 150–400)
RBC: 5.33 MIL/uL (ref 4.22–5.81)
RDW: 14.3 % (ref 11.5–15.5)
WBC: 8.6 10*3/uL (ref 4.0–10.5)
nRBC: 0 % (ref 0.0–0.2)

## 2018-06-18 LAB — BASIC METABOLIC PANEL
Anion gap: 11 (ref 5–15)
BUN: 11 mg/dL (ref 6–20)
CO2: 26 mmol/L (ref 22–32)
Calcium: 8.9 mg/dL (ref 8.9–10.3)
Chloride: 103 mmol/L (ref 98–111)
Creatinine, Ser: 1.19 mg/dL (ref 0.61–1.24)
GFR calc Af Amer: >60 mL/min (ref >60)
GFR calc non Af Amer: >60 mL/min (ref >60)
GLUCOSE: 107 mg/dL — AB (ref 70–99)
Potassium: 3.8 mmol/L (ref 3.5–5.1)
Sodium: 140 mmol/L (ref 135–145)

## 2018-06-18 LAB — I-STAT TROPONIN, ED: Troponin i, poc: 0 ng/mL (ref 0.00–0.08)

## 2018-06-18 MED ORDER — PANTOPRAZOLE SODIUM 20 MG PO TBEC: 20.0000 mg | DELAYED_RELEASE_TABLET | Freq: Every day | ORAL | 1 refills | Status: DC

## 2018-06-18 MED ORDER — SODIUM CHLORIDE 0.9% FLUSH: 3.0000 mL | Freq: Once | INTRAVENOUS | Status: DC

## 2018-06-18 NOTE — ED Notes (Signed)
Patient here with multiple complaints states he wants to be check  To see if he has been exposed to mold, c/o right hand pain and states he needs medicine for acid reflux.

## 2018-06-18 NOTE — Discharge Instructions (Signed)
Avoid spicy foods, citrus fruits, coffee, chocolate, excessive use of Motrin or Advil.  These may worsen your reflux symptoms.  Take Protonix as prescribed.  Follow-up with a primary care doctor.  If your hand symptoms persist, we advise follow-up with a hand specialist.
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
## 2018-06-18 NOTE — ED Triage Notes (Signed)
Patient with chest pain for the last two days, off and on.  Patient denies any nausea or vomiting with the pain.  Patient does have some shortness of breath with the pain.  He states he feels like an elephant sitting on his chest and back.

## 2019-03-22 ENCOUNTER — Emergency Department
Admission: EM | Admit: 2019-03-22 | Discharge: 2019-03-22 | Disposition: A | Discharge status: Home / Self Care | Payer: Self-pay | Attending: Emergency Medicine | Admitting: Emergency Medicine

## 2019-03-22 ENCOUNTER — Encounter: Payer: Self-pay | Admitting: Emergency Medicine

## 2019-03-22 ENCOUNTER — Other Ambulatory Visit: Payer: Self-pay

## 2019-03-22 DIAGNOSIS — Z79899 Other long term (current) drug therapy: Secondary | ICD-10-CM | POA: Insufficient documentation

## 2019-03-22 DIAGNOSIS — F17210 Nicotine dependence, cigarettes, uncomplicated: Secondary | ICD-10-CM | POA: Insufficient documentation

## 2019-03-22 DIAGNOSIS — Z0279 Encounter for issue of other medical certificate: Secondary | ICD-10-CM | POA: Insufficient documentation

## 2019-03-22 DIAGNOSIS — F1210 Cannabis abuse, uncomplicated: Secondary | ICD-10-CM | POA: Insufficient documentation

## 2019-03-22 DIAGNOSIS — K219 Gastro-esophageal reflux disease without esophagitis: Secondary | ICD-10-CM | POA: Insufficient documentation

## 2019-03-22 MED ORDER — PANTOPRAZOLE SODIUM 40 MG PO TBEC: 40.0000 mg | DELAYED_RELEASE_TABLET | Freq: Every day | ORAL | 1 refills | Status: DC

## 2019-03-22 NOTE — ED Triage Notes (Addendum)
Pt c/o low back as well as acid reflux. Pt states sxs have been going on for " a minute"  Pt reports he used to be on meds for acid reflux but hasn't taken them.

## 2019-03-22 NOTE — ED Provider Notes (Signed)
[facility] Emergency Department Provider Note  ____________________________________________  Time seen: Approximately 10:16 PM  I have reviewed the triage vital signs and the nursing notes.   HISTORY  Chief Complaint Back Pain and Gastroesophageal Reflux    HPI Colin Glass is a 33 y.o. male presents to the emergency department for a prescription of Protonix and a work note.  Patient states that he has been taking his mother's Protonix, 40 mg daily and states that medication has been helping him.  He denies current chest pain, chest tightness or abdominal pain.  No other alleviating measures have been attempted.        Past Medical History:  Diagnosis Date  . GERD (gastroesophageal reflux disease)     There are no active problems to display for this patient.   Past Surgical History:  Procedure Laterality Date  . DENTAL SURGERY    . MOUTH SURGERY      Prior to Admission medications   Medication Sig Start Date End Date Taking? Authorizing Provider  albuterol (PROVENTIL HFA;VENTOLIN HFA) 108 (90 BASE) MCG/ACT inhaler Inhale 1-2 puffs into the lungs every 6 (six) hours as needed for wheezing or shortness of breath.    [provider]  albuterol (PROVENTIL HFA;VENTOLIN HFA) 108 (90 Base) MCG/ACT inhaler Inhale 2 puffs into the lungs every 6 (six) hours as needed for wheezing or shortness of breath. 10/21/17   Menshew, Dannielle Karvonen, PA-C  benzonatate (TESSALON PERLES) 100 MG capsule Take 1-2 tabs TID prn cough 10/21/17   Menshew, Dannielle Karvonen, PA-C  fluticasone (FLONASE) 50 MCG/ACT nasal spray Place 2 sprays into both nostrils daily. 10/21/17   Menshew, Dannielle Karvonen, PA-C  meloxicam (MOBIC) 15 MG tablet Take 1 tablet (15 mg total) by mouth daily. 11/20/17   Sable Feil, PA-C  pantoprazole (PROTONIX) 40 MG tablet Take 1 tablet (40 mg total) by mouth daily for 21 days. 03/22/19 04/12/19  Lannie Fields, PA-C  predniSONE (DELTASONE) 10 MG  tablet Take 1 tablet (10 mg total) by mouth 2 (two) times daily with a meal. 10/21/17   Menshew, Dannielle Karvonen, PA-C  ranitidine (ZANTAC) 150 MG tablet Take 150 mg by mouth 2 (two) times daily.    [provider]    Allergies Patient has no known allergies.  History reviewed. No pertinent family history.  Social History Social History   Tobacco Use  . Smoking status: Current Every Day Smoker    Packs/day: 0.50    Types: Cigarettes    Last attempt to quit: 10/21/2014    Years since quitting: 4.4  . Smokeless tobacco: Never Used  Substance Use Topics  . Alcohol use: Yes  . Drug use: Yes    Types: Marijuana     Review of Systems  Constitutional: No fever/chills Eyes: No visual changes. No discharge ENT: No upper respiratory complaints. Cardiovascular: no chest pain. Respiratory: no cough. No SOB. Gastrointestinal: No abdominal pain.  No nausea, no vomiting.  No diarrhea.  No constipation. Genitourinary: Negative for dysuria. No hematuria Musculoskeletal: Negative for musculoskeletal pain. Skin: Negative for rash, abrasions, lacerations, ecchymosis. Neurological: Negative for headaches, focal weakness or numbness.  ____________________________________________   PHYSICAL EXAM:  VITAL SIGNS: ED Triage Vitals  Enc Vitals Group     BP 03/22/19 2119 128/70     Pulse Rate 03/22/19 2119 88     Resp 03/22/19 2119 16     Temp 03/22/19 2119 98.5 F (36.9 C)  Temp Source 03/22/19 2119 Oral     SpO2 03/22/19 2119 98 %     Weight 03/22/19 2118 180 lb (81.6 kg)     Height 03/22/19 2118 6\' 2"  (1.88 m)     Head Circumference --      Peak Flow --      Pain Score 03/22/19 2118 8     Pain Loc --      Pain Edu? --      Excl. in [place]? --      Constitutional: Alert and oriented. Well appearing and in no acute distress. Eyes: Conjunctivae are normal. PERRL. EOMI. Head: Atraumatic. Cardiovascular: Normal rate, regular rhythm. Normal S1 and S2.  Good peripheral  circulation. Respiratory: Normal respiratory effort without tachypnea or retractions. Lungs CTAB. Good air entry to the bases with no decreased or absent breath sounds. Gastrointestinal: Bowel sounds 4 quadrants. Soft and nontender to palpation. No guarding or rigidity. No palpable masses. No distention. No CVA tenderness. Musculoskeletal: Full range of motion to all extremities. No gross deformities appreciated. Neurologic:  Normal speech and language. No gross focal neurologic deficits are appreciated.  Skin:  Skin is warm, dry and intact. No rash noted. Psychiatric: Mood and affect are normal. Speech and behavior are normal. Patient exhibits appropriate insight and judgement.   ____________________________________________   LABS (all labs ordered are listed, but only abnormal results are displayed)  Labs Reviewed - No data to display ____________________________________________  EKG   ____________________________________________  RADIOLOGY  No results found.  ____________________________________________    PROCEDURES  Procedure(s) performed:    Procedures    Medications - No data to display   ____________________________________________   INITIAL IMPRESSION / ASSESSMENT AND PLAN / ED COURSE  Pertinent labs & imaging results that were available during my care of the patient were reviewed by me and considered in my medical decision making (see chart for details).  Review of the [place] CSRS was performed in accordance of the [place] prior to dispensing any controlled drugs.         Assessment and plan Acid reflux 33 year old male presents to the emergency department for a refill of Protonix and a work note.  Request was granted.  Patient denies current chest pain, chest tightness or abdominal pain.    ____________________________________________  FINAL CLINICAL IMPRESSION(S) / ED DIAGNOSES  Final diagnoses:  Gastroesophageal reflux disease, unspecified  whether esophagitis present      NEW MEDICATIONS STARTED DURING THIS VISIT:  ED Discharge Orders         Ordered    pantoprazole (PROTONIX) 40 MG tablet  Daily     03/22/19 2212              This chart was dictated using voice recognition software/Dragon. Despite best efforts to proofread, errors can occur which can change the meaning. Any change was purely unintentional.    Lannie Fields, PA-C 03/22/19 2218    Arta Silence, MD 03/22/19 [phone]

## 2019-06-15 ENCOUNTER — Other Ambulatory Visit: Payer: Self-pay

## 2019-06-15 ENCOUNTER — Encounter: Payer: Self-pay | Admitting: *Deleted

## 2019-06-15 ENCOUNTER — Emergency Department
Admission: EM | Admit: 2019-06-15 | Discharge: 2019-06-15 | Disposition: A | Discharge status: Home / Self Care | Payer: Self-pay | Attending: Emergency Medicine | Admitting: Emergency Medicine

## 2019-06-15 DIAGNOSIS — R221 Localized swelling, mass and lump, neck: Secondary | ICD-10-CM | POA: Insufficient documentation

## 2019-06-15 DIAGNOSIS — R52 Pain, unspecified: Secondary | ICD-10-CM

## 2019-06-15 DIAGNOSIS — Z79899 Other long term (current) drug therapy: Secondary | ICD-10-CM | POA: Insufficient documentation

## 2019-06-15 DIAGNOSIS — Z87891 Personal history of nicotine dependence: Secondary | ICD-10-CM | POA: Insufficient documentation

## 2019-06-15 NOTE — ED Triage Notes (Signed)
PT reporting noting that his lymph nodes on both sides of his neck were swollen while eating pizza tonight. They were tender upon palpation at first but are no longer painful at this time. No sore throat or congestion. NO known exposure to COVID.

## 2019-06-15 NOTE — ED Notes (Signed)
Pt states he noticed swelling to neck while eating dinner, denies pain, trouble breathing or swallowing. No recent illness no sore throat no cough.

## 2019-06-15 NOTE — ED Provider Notes (Signed)
[facility] Emergency Department Provider Note ____________________________________________  Time seen: 2045  I have reviewed the triage vital signs and the nursing notes.  HISTORY  Chief Complaint  Swollen lymph nodes  HPI Colin Glass is a 34 y.o. male presents himself to the ED for evaluation of what he assumes are lymph nodes to the sides of his neck.  He describes noting the tender swollen areas tonight while eating pizza.  He reports that initially they were painful but at this point reports he is no longer tender. He has no recent dental work, purulent drainage, or facial drainage. He denies any sore throat, cough, congestion.  He denies any fever, chills, sweats.  Patient denies any recent sick contacts, recent travel, or other high risk exposures.    Past Medical History:  Diagnosis Date  . GERD (gastroesophageal reflux disease)     There are no problems to display for this patient.   Past Surgical History:  Procedure Laterality Date  . DENTAL SURGERY    . MOUTH SURGERY      Prior to Admission medications   Medication Sig Start Date End Date Taking? Authorizing Provider  albuterol (PROVENTIL HFA;VENTOLIN HFA) 108 (90 BASE) MCG/ACT inhaler Inhale 1-2 puffs into the lungs every 6 (six) hours as needed for wheezing or shortness of breath.    [provider]  albuterol (PROVENTIL HFA;VENTOLIN HFA) 108 (90 Base) MCG/ACT inhaler Inhale 2 puffs into the lungs every 6 (six) hours as needed for wheezing or shortness of breath. 10/21/17   Haiden Clucas, Dannielle Karvonen, PA-C  benzonatate (TESSALON PERLES) 100 MG capsule Take 1-2 tabs TID prn cough 10/21/17   Turrell Severt, Dannielle Karvonen, PA-C  fluticasone (FLONASE) 50 MCG/ACT nasal spray Place 2 sprays into both nostrils daily. 10/21/17   Maneh Sieben, Dannielle Karvonen, PA-C  meloxicam (MOBIC) 15 MG tablet Take 1 tablet (15 mg total) by mouth daily. 11/20/17   Sable Feil, PA-C  pantoprazole (PROTONIX) 40 MG tablet  Take 1 tablet (40 mg total) by mouth daily for 21 days. 03/22/19 04/12/19  Lannie Fields, PA-C  predniSONE (DELTASONE) 10 MG tablet Take 1 tablet (10 mg total) by mouth 2 (two) times daily with a meal. 10/21/17   Oluwatosin Higginson, Dannielle Karvonen, PA-C  ranitidine (ZANTAC) 150 MG tablet Take 150 mg by mouth 2 (two) times daily.    [provider]    Allergies Patient has no known allergies.  History reviewed. No pertinent family history.  Social History Social History   Tobacco Use  . Smoking status: Former Smoker    Packs/day: 0.50    Types: Cigarettes    Quit date: 10/21/2014    Years since quitting: 4.6  . Smokeless tobacco: Never Used  . Tobacco comment: pt reports currently smoking 1 black and mild a day  Substance Use Topics  . Alcohol use: Yes    Comment: socially  . Drug use: Yes    Types: Marijuana    Review of Systems  Constitutional: Negative for fever. Eyes: Negative for visual changes. ENT: Negative for sore throat. Palpable swelling to sides of neck Cardiovascular: Negative for chest pain. Respiratory: Negative for shortness of breath. Gastrointestinal: Negative for abdominal pain, vomiting and diarrhea. Genitourinary: Negative for dysuria. Musculoskeletal: Negative for back pain. Skin: Negative for rash. Neurological: Negative for headaches, focal weakness or numbness. ____________________________________________  PHYSICAL EXAM:  VITAL SIGNS: ED Triage Vitals [06/15/19 2001]  Enc Vitals Group     BP (!) 148/85  Pulse Rate 82     Resp 16     Temp 98 F (36.7 C)     Temp Source Oral     SpO2 98 %     Weight      Height      Head Circumference      Peak Flow      Pain Score 1     Pain Loc      Pain Edu?      Excl. in [place]?     Constitutional: Alert and oriented. Well appearing and in no distress. Head: Normocephalic and atraumatic. Eyes: Conjunctivae are normal. Normal extraocular movements Nose: No  congestion/rhinorrhea/epistaxis. Mouth/Throat: Mucous membranes are moist.  Uvula is midline and tonsils are flat.  No oropharyngeal erythema noted.  No brawny sublingual edema appreciated.  No jaw angle fullness or tenderness to the preauricular region.  No dental abscess or dental injury appreciated. Neck: Supple. No thyromegaly.  Patient with bilaterally palpable submandibular salivary glands.  No nodularity, tenderness, warmth is appreciated.  Parotid glands are flat and without nodularity, inflammation, erythema, or edema. Hematological/Lymphatic/Immunological: No cervical, preauricular, supraclavicular lymphadenopathy. Cardiovascular: Normal rate, regular rhythm. Normal distal pulses. Respiratory: Normal respiratory effort. No wheezes/rales/rhonchi. Gastrointestinal: Soft and nontender. No distention. Musculoskeletal: Nontender with normal range of motion in all extremities.  Neurologic:  Normal gait without ataxia. Normal speech and language. No gross focal neurologic deficits are appreciated. Skin:  Skin is warm, dry and intact. No rash noted. Psychiatric: Mood and affect are normal. Patient exhibits appropriate insight and judgment. ____________________________________________  PROCEDURES  Procedures ____________________________________________  INITIAL IMPRESSION / ASSESSMENT AND PLAN / ED COURSE  DDX: lymphadenitis, parotiditis, Ludwig angina, dental abscess, tonsillitis, dental abscess.  Patient with ED evaluation of concerning bilateral swelling to the area under the lower jaw.  Patient clinical picture is overall reassuring at this time.  No indication of any acute infectious process, abscess, or inflammatory lesion.  Patient's lymph nodes are flat and benign.  Clinical assessment is consistent with palpable submandibular salivary glands.  Patient is reassured that no infectious respiratory process exist at this time.  He is encouraged to continue to injury could normal.  He may  take a a sour candy and/or lemon drops to help express salivary glands at this time.  He is referred to his primary provider or ENT should symptoms recur.  Return precautions have been reviewed.  Colin Glass was evaluated in Emergency Department on 06/15/2019 for the symptoms described in the history of present illness. He was evaluated in the context of the global COVID-19 pandemic, which necessitated consideration that the patient might be at risk for infection with the SARS-CoV-2 virus that causes COVID-19. Institutional protocols and algorithms that pertain to the evaluation of patients at risk for COVID-19 are in a state of rapid change based on information released by regulatory bodies including the CDC and federal and state organizations. These policies and algorithms were followed during the patient's care in the ED. ____________________________________________  FINAL CLINICAL IMPRESSION(S) / ED DIAGNOSES  Final diagnoses:  Tenderness of salivary gland region      Melvenia Needles, PA-C 06/15/19 2239    Earleen Newport, MD 06/15/19 2250

## 2019-06-15 NOTE — Discharge Instructions (Addendum)
Your exam is normal and without any signs of an infection or abscess in your salivary glands. You may consider eating sour candy or fresh lemon slices to promote a therapeutic contraction of your salivary glands. Continue with your regular dental routine and consider rinsing with warm salty water over the next few days. Return to the ED as needed.

## 2019-06-18 ENCOUNTER — Emergency Department: Payer: Self-pay

## 2019-06-18 ENCOUNTER — Encounter: Payer: Self-pay | Admitting: Emergency Medicine

## 2019-06-18 ENCOUNTER — Other Ambulatory Visit: Payer: Self-pay

## 2019-06-18 ENCOUNTER — Emergency Department
Admission: EM | Admit: 2019-06-18 | Discharge: 2019-06-19 | Disposition: A | Discharge status: Home / Self Care | Payer: Self-pay | Attending: Emergency Medicine | Admitting: Emergency Medicine

## 2019-06-18 DIAGNOSIS — Z87891 Personal history of nicotine dependence: Secondary | ICD-10-CM | POA: Insufficient documentation

## 2019-06-18 DIAGNOSIS — Z79899 Other long term (current) drug therapy: Secondary | ICD-10-CM | POA: Insufficient documentation

## 2019-06-18 DIAGNOSIS — I889 Nonspecific lymphadenitis, unspecified: Secondary | ICD-10-CM | POA: Insufficient documentation

## 2019-06-18 LAB — TSH: TSH: 1.164 u[IU]/mL (ref 0.350–4.500)

## 2019-06-18 LAB — CBC WITH DIFFERENTIAL/PLATELET
Abs Immature Granulocytes: 0.01 10*3/uL (ref 0.00–0.07)
Basophils Absolute: 0.1 10*3/uL (ref 0.0–0.1)
Basophils Relative: 1 %
Eosinophils Absolute: 0.3 10*3/uL (ref 0.0–0.5)
Eosinophils Relative: 4 %
HCT: 45.5 % (ref 39.0–52.0)
Hemoglobin: 15.2 g/dL (ref 13.0–17.0)
Immature Granulocytes: 0 %
Lymphocytes Relative: 44 %
Lymphs Abs: 3.4 10*3/uL (ref 0.7–4.0)
MCH: 28.5 pg (ref 26.0–34.0)
MCHC: 33.4 g/dL (ref 30.0–36.0)
MCV: 85.4 fL (ref 80.0–100.0)
Monocytes Absolute: 0.6 10*3/uL (ref 0.1–1.0)
Monocytes Relative: 8 %
Neutro Abs: 3.3 10*3/uL (ref 1.7–7.7)
Neutrophils Relative %: 43 %
Platelets: 215 10*3/uL (ref 150–400)
RBC: 5.33 MIL/uL (ref 4.22–5.81)
RDW: 14.2 % (ref 11.5–15.5)
WBC: 7.7 10*3/uL (ref 4.0–10.5)
nRBC: 0 % (ref 0.0–0.2)

## 2019-06-18 LAB — BASIC METABOLIC PANEL
Anion gap: 9 (ref 5–15)
BUN: 15 mg/dL (ref 6–20)
CO2: 27 mmol/L (ref 22–32)
Calcium: 9 mg/dL (ref 8.9–10.3)
Chloride: 102 mmol/L (ref 98–111)
Creatinine, Ser: 1.28 mg/dL — ABNORMAL HIGH (ref 0.61–1.24)
GFR calc Af Amer: >60 mL/min (ref >60)
GFR calc non Af Amer: >60 mL/min (ref >60)
Glucose, Bld: 95 mg/dL (ref 70–99)
Potassium: 3.8 mmol/L (ref 3.5–5.1)
Sodium: 138 mmol/L (ref 135–145)

## 2019-06-18 MED ORDER — IOHEXOL 300 MG/ML  SOLN
75.0000 mL | Freq: Once | INTRAMUSCULAR | Status: AC | PRN
  Administered 2019-06-18: 75 mL via INTRAVENOUS
  Filled 2019-06-18: qty 75

## 2019-06-18 NOTE — ED Notes (Signed)
See triage note, pt states his glands in his throat feel swollen and like there's "something stuck". Denies fevers.  Hx acid reflux.  Pt in NAD at this time.

## 2019-06-18 NOTE — ED Triage Notes (Signed)
Pt c/o sore throat and congestion buildup in throat since being seen in ED on 2/8. Pt denies being given medication and sts he was told to use hard candy to help. Pt has notable tenderness and swelling to both sides of posterior neck.

## 2019-06-18 NOTE — ED Provider Notes (Signed)
[facility] Emergency Department Provider Note  ____________________________________________   First MD Initiated Contact with Patient 06/18/19 2300     (approximate)  I have reviewed the triage vital signs and the nursing notes.   HISTORY  Chief Complaint Sore Throat    HPI Colin Glass is a 34 y.o. male presents emergency department complaining of gland swelling, the front of his throat feeling like it swelling and pushing in on his throat, feels like something is stuck, states feels like this is making his acid reflux worse.  He states he can feel lymph nodes at the top of his neck.  He denies any fever or chills.  He denies chest pain or shortness of breath.  No history of thyroid disease.    Past Medical History:  Diagnosis Date  . GERD (gastroesophageal reflux disease)     There are no problems to display for this patient.   Past Surgical History:  Procedure Laterality Date  . DENTAL SURGERY    . MOUTH SURGERY      Prior to Admission medications   Medication Sig Start Date End Date Taking? Authorizing Provider  albuterol (PROVENTIL HFA;VENTOLIN HFA) 108 (90 Base) MCG/ACT inhaler Inhale 2 puffs into the lungs every 6 (six) hours as needed for wheezing or shortness of breath. 10/21/17   Menshew, Dannielle Karvonen, PA-C  amoxicillin (AMOXIL) 500 MG capsule Take 1 capsule (500 mg total) by mouth 3 (three) times daily. 06/19/19   [place] Seever, Linden Dolin, PA-C  fluticasone (FLONASE) 50 MCG/ACT nasal spray Place 2 sprays into both nostrils daily. 10/21/17   Menshew, Dannielle Karvonen, PA-C  pantoprazole (PROTONIX) 40 MG tablet Take 1 tablet (40 mg total) by mouth daily for 21 days. 06/19/19 07/10/19  Versie Starks, PA-C    Allergies Patient has no known allergies.  History reviewed. No pertinent family history.  Social History Social History   Tobacco Use  . Smoking status: Former Smoker    Packs/day: 0.50    Types: Cigarettes    Quit date: 10/21/2014      Years since quitting: 4.6  . Smokeless tobacco: Never Used  . Tobacco comment: pt reports currently smoking 1 black and mild a day  Substance Use Topics  . Alcohol use: Yes    Comment: socially  . Drug use: Yes    Types: Marijuana    Review of Systems  Constitutional: No fever/chills Eyes: No visual changes. ENT: No sore throat.  Positive for gland swelling and questionable thyroid enlargement Respiratory: Denies cough Cardiovascular: Denies chest pain Gastrointestinal: Denies abdominal pain Genitourinary: Negative for dysuria. Musculoskeletal: Negative for back pain. Skin: Negative for rash. Psychiatric: no mood changes,     ____________________________________________   PHYSICAL EXAM:  VITAL SIGNS: ED Triage Vitals [06/18/19 2211]  Enc Vitals Group     BP 138/79     Pulse Rate 88     Resp 17     Temp 99.2 F (37.3 C)     Temp Source Oral     SpO2 98 %     Weight      Height      Head Circumference      Peak Flow      Pain Score      Pain Loc      Pain Edu?      Excl. in [place]?     Constitutional: Alert and oriented. Well appearing and in no acute distress. Eyes: Conjunctivae are normal.  Head: Atraumatic.  Nose: No congestion/rhinnorhea. Mouth/Throat: Mucous membranes are moist.   Throat appears normal Neck:  supple positive lymphadenopathy noted in the mandibular area, glands are swollen along side of the neck, swelling noted at the lower part of the neck questionably enlarged thyroid Cardiovascular: Normal rate, regular rhythm. Heart sounds are normal Respiratory: Normal respiratory effort.  No retractions, lungs c t a  GU: deferred Musculoskeletal: FROM all extremities, warm and well perfused Neurologic:  Normal speech and language.  Skin:  Skin is warm, dry and intact. No rash noted. Psychiatric: Mood and affect are normal. Speech and behavior are normal.  ____________________________________________   LABS (all labs ordered are listed, but  only abnormal results are displayed)  Labs Reviewed  BASIC METABOLIC PANEL - Abnormal; Notable for the following components:      Result Value   Creatinine, Ser 1.28 (*)    All other components within normal limits  CBC WITH DIFFERENTIAL/PLATELET  TSH   ____________________________________________   ____________________________________________  RADIOLOGY  CT soft tissue of the neck with IV contrast is negative  ____________________________________________   PROCEDURES  Procedure(s) performed: No  Procedures    ____________________________________________   INITIAL IMPRESSION / ASSESSMENT AND PLAN / ED COURSE  Pertinent labs & imaging results that were available during my care of the patient were reviewed by me and considered in my medical decision making (see chart for details).   Patient is a 34 year old male presents emergency department with swollen glands and feeling that something is stuck in his throat.  See HPI  Physical exam shows the neck to appear swollen, lymphadenopathy noted up along the mandibular area, questionably enlarged thyroid.  Remainder of exam is unremarkable  DDx: Tonsillitis, lymphoma, goiter  CBC, metabolic panel, TSH CT soft tissue of the neck with contrast   CT soft tissue of the neck is negative, CBC metabolic panel and TSH are normal  Explained the findings to the patient.  He was given a prescription for amoxicillin.  Due to the dental cavities I told him this could be causing the lymph nodes along his jawline to be enlarged.  Therefore we will treat him for infection and see if this resolves the sensation he is feeling.  If not then he can follow-up with the ENT.  States he understands will comply.  Is discharged stable condition.  Colin Glass was evaluated in Emergency Department on 06/19/2019 for the symptoms described in the history of present illness. He was evaluated in the context of the global COVID-19 pandemic, which  necessitated consideration that the patient might be at risk for infection with the SARS-CoV-2 virus that causes COVID-19. Institutional protocols and algorithms that pertain to the evaluation of patients at risk for COVID-19 are in a state of rapid change based on information released by regulatory bodies including the CDC and federal and state organizations. These policies and algorithms were followed during the patient's care in the ED.   As part of my medical decision making, I reviewed the following data within the [place] notes reviewed and incorporated, Labs reviewed see above, Old chart reviewed, Radiograph reviewed CT negative, Notes from prior ED visits and [place] Controlled Substance Database  ____________________________________________   FINAL CLINICAL IMPRESSION(S) / ED DIAGNOSES  Final diagnoses:  Lymphadenitis      NEW MEDICATIONS STARTED DURING THIS VISIT:  New Prescriptions   AMOXICILLIN (AMOXIL) 500 MG CAPSULE    Take 1 capsule (500 mg total) by mouth 3 (three) times daily.  Note:  This document was prepared using Dragon voice recognition software and may include unintentional dictation errors.    Versie Starks, PA-C 06/19/19 0025    Gregor Hams, MD 06/19/19 [phone]

## 2019-06-19 MED ORDER — PANTOPRAZOLE SODIUM 40 MG PO TBEC: 40.0000 mg | DELAYED_RELEASE_TABLET | Freq: Every day | ORAL | 1 refills | Status: DC

## 2019-06-19 MED ORDER — AMOXICILLIN 500 MG PO CAPS
500.0000 mg | ORAL_CAPSULE | Freq: Once | ORAL | Status: AC
  Administered 2019-06-19: 01:00:00 500 mg via ORAL
  Filled 2019-06-19: qty 1

## 2019-06-19 MED ORDER — AMOXICILLIN 500 MG PO CAPS: 500.0000 mg | ORAL_CAPSULE | Freq: Three times a day (TID) | ORAL | 0 refills | Status: DC

## 2019-06-19 NOTE — Discharge Instructions (Addendum)
Follow-up with [place] [facility] if not improving in 3 to 5 days.  Return emergency department worsening.  Use medication as prescribed

## 2019-07-07 ENCOUNTER — Emergency Department
Admission: EM | Admit: 2019-07-07 | Discharge: 2019-07-07 | Disposition: A | Discharge status: Home / Self Care | Payer: Medicaid Other | Attending: Emergency Medicine | Admitting: Emergency Medicine

## 2019-07-07 ENCOUNTER — Encounter: Payer: Self-pay | Admitting: Emergency Medicine

## 2019-07-07 ENCOUNTER — Other Ambulatory Visit: Payer: Self-pay

## 2019-07-07 DIAGNOSIS — J029 Acute pharyngitis, unspecified: Secondary | ICD-10-CM | POA: Insufficient documentation

## 2019-07-07 DIAGNOSIS — Z720 Tobacco use: Secondary | ICD-10-CM | POA: Insufficient documentation

## 2019-07-07 DIAGNOSIS — Z79899 Other long term (current) drug therapy: Secondary | ICD-10-CM | POA: Insufficient documentation

## 2019-07-07 NOTE — ED Provider Notes (Signed)
[facility] Emergency Department Provider Note   ____________________________________________   First MD Initiated Contact with Patient 07/07/19 1246     (approximate)  I have reviewed the triage vital signs and the nursing notes.   HISTORY  Chief Complaint Sore Throat    HPI Colin Glass is a 34 y.o. male patient presents with continued complaint of sore throat.  Patient can tolerate food and fluids.  This is the patient's third visit for this complaint in 3 weeks.  Patient was evaluated with labs and CT imaging which were unremarkable.  Patient has to be have a scope to look down his throat.  Patient was advised on each visit to follow-up with the ENT.  Patient said he was not aware to full to follow up with ENT.         Past Medical History:  Diagnosis Date  . GERD (gastroesophageal reflux disease)     There are no problems to display for this patient.   Past Surgical History:  Procedure Laterality Date  . DENTAL SURGERY    . MOUTH SURGERY      Prior to Admission medications   Medication Sig Start Date End Date Taking? Authorizing Provider  albuterol (PROVENTIL HFA;VENTOLIN HFA) 108 (90 Base) MCG/ACT inhaler Inhale 2 puffs into the lungs every 6 (six) hours as needed for wheezing or shortness of breath. 10/21/17   Menshew, Dannielle Karvonen, PA-C  fluticasone (FLONASE) 50 MCG/ACT nasal spray Place 2 sprays into both nostrils daily. 10/21/17   Menshew, Dannielle Karvonen, PA-C  pantoprazole (PROTONIX) 40 MG tablet Take 1 tablet (40 mg total) by mouth daily for 21 days. 06/19/19 07/10/19  Versie Starks, PA-C    Allergies Patient has no known allergies.  No family history on file.  Social History Social History   Tobacco Use  . Smoking status: Former Smoker    Packs/day: 0.50    Types: Cigarettes    Quit date: 10/21/2014    Years since quitting: 4.7  . Smokeless tobacco: Never Used  . Tobacco comment: pt reports currently smoking 1 black  and mild a day  Substance Use Topics  . Alcohol use: Yes    Comment: socially  . Drug use: Yes    Types: Marijuana    Review of Systems Constitutional: No fever/chills Eyes: No visual changes.  ENT: Sore throat. Cardiovascular: Denies chest pain. Respiratory: Denies shortness of breath. Gastrointestinal: No abdominal pain.  No nausea, no vomiting.  No diarrhea.  No constipation. Genitourinary: Negative for dysuria. Musculoskeletal: Negative for back pain. Skin: Negative for rash. Neurological: Negative for headaches, focal weakness or numbness.   ____________________________________________   PHYSICAL EXAM:  VITAL SIGNS: ED Triage Vitals [07/07/19 1232]  Enc Vitals Group     BP 124/74     Pulse Rate 74     Resp 18     Temp 98.7 F (37.1 C)     Temp Source Oral     SpO2 99 %     Weight 180 lb (81.6 kg)     Height 6\' 2"  (1.88 m)     Head Circumference      Peak Flow      Pain Score      Pain Loc      Pain Edu?      Excl. in [place]?    Constitutional: Alert and oriented. Well appearing and in no acute distress. Eyes: Conjunctivae are normal. PERRL. EOMI. Head: Atraumatic. Nose: No congestion/rhinnorhea. Mouth/Throat:  Mucous membranes are moist.  Oropharynx non-erythematous.  Tonsils are nonedematous and without erythema or exudate. Multiple caries and devitalized teeth. Neck: No stridor.   Hematological/Lymphatic/Immunilogical: No cervical lymphadenopathy. Cardiovascular: Normal rate, regular rhythm. Grossly normal heart sounds.  Good peripheral circulation. Respiratory: Normal respiratory effort.  No retractions. Lungs CTAB. Gastrointestinal: Soft and nontender. No distention. No abdominal bruits. No CVA tenderness. Musculoskeletal: No lower extremity tenderness nor edema.  No joint effusions. Neurologic:  Normal speech and language. No gross focal neurologic deficits are appreciated. No gait instability. Skin:  Skin is warm, dry and intact. No rash  noted. Psychiatric: Mood and affect are normal. Speech and behavior are normal.  ____________________________________________   LABS (all labs ordered are listed, but only abnormal results are displayed)  Labs Reviewed - No data to display ____________________________________________  EKG   ____________________________________________  RADIOLOGY  ED MD interpretation:    Official radiology report(s): No results found.  ____________________________________________   PROCEDURES  Procedure(s) performed (including Critical Care):  Procedures   ____________________________________________   INITIAL IMPRESSION / ASSESSMENT AND PLAN / ED COURSE  As part of my medical decision making, I reviewed the following data within the [place]     Patient presents with continued sore throat for approximately 1 month.  Physical exam is grossly unremarkable except for devitalized teeth and multiple caries.  Reviewed past labs and CT findings with patient.  Patient advised to follow-up to ENT for definitive evaluation.    Colin Glass was evaluated in Emergency Department on 07/07/2019 for the symptoms described in the history of present illness. He was evaluated in the context of the global COVID-19 pandemic, which necessitated consideration that the patient might be at risk for infection with the SARS-CoV-2 virus that causes COVID-19. Institutional protocols and algorithms that pertain to the evaluation of patients at risk for COVID-19 are in a state of rapid change based on information released by regulatory bodies including the CDC and federal and state organizations. These policies and algorithms were followed during the patient's care in the ED.       ____________________________________________   FINAL CLINICAL IMPRESSION(S) / ED DIAGNOSES  Final diagnoses:  Sore throat     ED Discharge Orders    None       Note:  This document was prepared using  Dragon voice recognition software and may include unintentional dictation errors.    Sable Feil, PA-C 07/07/19 1307    Carrie Mew, MD 07/07/19 1540

## 2019-07-07 NOTE — Discharge Instructions (Addendum)
Advised to follow-up with the ENT clinic for definitive evaluation and treatment.  When you call the clinic tell them you are a follow-up from the emergency room.

## 2019-07-07 NOTE — ED Triage Notes (Signed)
Pt presents to ED via POV with c/o sore throat. Pt states was given Amoxicillin and told to take for 10 days without relief. Pt states no relief from Amoxicillin, pt able to maintain his own secretions, NAD noted at this time.

## 2019-07-07 NOTE — ED Notes (Signed)
See triage note  Presents with sore throat  States he feels like he has a "lump" in his throat  No fever

## 2019-07-13 ENCOUNTER — Ambulatory Visit
Admission: RE | Admit: 2019-07-13 | Discharge: 2019-07-13 | Disposition: A | Discharge status: Home / Self Care | Payer: Medicaid Other | Source: Ambulatory Visit | Attending: Otolaryngology | Admitting: Otolaryngology

## 2019-07-13 ENCOUNTER — Other Ambulatory Visit (HOSPITAL_COMMUNITY): Payer: Self-pay | Admitting: Otolaryngology

## 2019-07-13 ENCOUNTER — Other Ambulatory Visit: Payer: Self-pay

## 2019-07-13 ENCOUNTER — Other Ambulatory Visit: Payer: Self-pay | Admitting: Otolaryngology

## 2019-07-13 DIAGNOSIS — D164 Benign neoplasm of bones of skull and face: Secondary | ICD-10-CM

## 2019-07-15 ENCOUNTER — Other Ambulatory Visit: Payer: Self-pay

## 2019-07-15 ENCOUNTER — Ambulatory Visit
Admission: RE | Admit: 2019-07-15 | Discharge: 2019-07-15 | Disposition: A | Discharge status: Home / Self Care | Payer: Medicaid Other | Source: Ambulatory Visit | Attending: Otolaryngology | Admitting: Otolaryngology

## 2019-07-15 DIAGNOSIS — D164 Benign neoplasm of bones of skull and face: Secondary | ICD-10-CM

## 2019-07-15 MED ORDER — GADOBUTROL 1 MMOL/ML IV SOLN
8.0000 mL | Freq: Once | INTRAVENOUS | Status: AC | PRN
  Administered 2019-07-15: 8 mL via INTRAVENOUS

## 2019-07-16 ENCOUNTER — Emergency Department: Payer: Self-pay

## 2019-07-16 ENCOUNTER — Emergency Department
Admission: EM | Admit: 2019-07-16 | Discharge: 2019-07-16 | Disposition: A | Discharge status: Home / Self Care | Payer: Self-pay | Attending: Emergency Medicine | Admitting: Emergency Medicine

## 2019-07-16 ENCOUNTER — Other Ambulatory Visit: Payer: Self-pay

## 2019-07-16 DIAGNOSIS — S60221A Contusion of right hand, initial encounter: Secondary | ICD-10-CM | POA: Insufficient documentation

## 2019-07-16 DIAGNOSIS — S61451A Open bite of right hand, initial encounter: Secondary | ICD-10-CM | POA: Insufficient documentation

## 2019-07-16 DIAGNOSIS — Y929 Unspecified place or not applicable: Secondary | ICD-10-CM | POA: Insufficient documentation

## 2019-07-16 DIAGNOSIS — Z87891 Personal history of nicotine dependence: Secondary | ICD-10-CM | POA: Insufficient documentation

## 2019-07-16 DIAGNOSIS — Y9389 Activity, other specified: Secondary | ICD-10-CM | POA: Insufficient documentation

## 2019-07-16 DIAGNOSIS — Y999 Unspecified external cause status: Secondary | ICD-10-CM | POA: Insufficient documentation

## 2019-07-16 DIAGNOSIS — Z79899 Other long term (current) drug therapy: Secondary | ICD-10-CM | POA: Insufficient documentation

## 2019-07-16 MED ORDER — AMOXICILLIN-POT CLAVULANATE 875-125 MG PO TABS: 1.0000 | ORAL_TABLET | Freq: Two times a day (BID) | ORAL | 0 refills | Status: AC

## 2019-07-16 MED ORDER — IBUPROFEN 800 MG PO TABS: 800.0000 mg | ORAL_TABLET | Freq: Three times a day (TID) | ORAL | 0 refills | Status: DC | PRN

## 2019-07-16 MED ORDER — TRAMADOL HCL 50 MG PO TABS
50.0000 mg | ORAL_TABLET | Freq: Once | ORAL | Status: AC
  Administered 2019-07-16: 50 mg via ORAL
  Filled 2019-07-16: qty 1

## 2019-07-16 MED ORDER — AMOXICILLIN-POT CLAVULANATE 875-125 MG PO TABS
1.0000 | ORAL_TABLET | Freq: Once | ORAL | Status: AC
  Administered 2019-07-16: 1 via ORAL
  Filled 2019-07-16: qty 1

## 2019-07-16 NOTE — ED Notes (Signed)
Wound on rt knuckle cleaned w. Betadine and NS. Gauze dressing applied and splint applied to pt's rt wrist per pt request.
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
## 2019-09-01 ENCOUNTER — Other Ambulatory Visit: Payer: Self-pay

## 2019-09-01 ENCOUNTER — Encounter: Payer: Self-pay | Admitting: Emergency Medicine

## 2019-09-01 ENCOUNTER — Emergency Department
Admission: EM | Admit: 2019-09-01 | Discharge: 2019-09-01 | Disposition: A | Discharge status: Home / Self Care | Payer: Medicaid Other | Attending: Emergency Medicine | Admitting: Emergency Medicine

## 2019-09-01 DIAGNOSIS — X58XXXA Exposure to other specified factors, initial encounter: Secondary | ICD-10-CM | POA: Insufficient documentation

## 2019-09-01 DIAGNOSIS — Y929 Unspecified place or not applicable: Secondary | ICD-10-CM | POA: Insufficient documentation

## 2019-09-01 DIAGNOSIS — R0982 Postnasal drip: Secondary | ICD-10-CM | POA: Insufficient documentation

## 2019-09-01 DIAGNOSIS — Z79899 Other long term (current) drug therapy: Secondary | ICD-10-CM | POA: Insufficient documentation

## 2019-09-01 DIAGNOSIS — Y939 Activity, unspecified: Secondary | ICD-10-CM | POA: Insufficient documentation

## 2019-09-01 DIAGNOSIS — Z87891 Personal history of nicotine dependence: Secondary | ICD-10-CM | POA: Insufficient documentation

## 2019-09-01 DIAGNOSIS — Y999 Unspecified external cause status: Secondary | ICD-10-CM | POA: Insufficient documentation

## 2019-09-01 DIAGNOSIS — T162XXA Foreign body in left ear, initial encounter: Secondary | ICD-10-CM | POA: Insufficient documentation

## 2019-09-01 NOTE — ED Provider Notes (Signed)
[facility] Emergency Department Provider Note  ____________________________________________   First MD Initiated Contact with Patient 09/01/19 1047     (approximate)  I have reviewed the triage vital signs and the nursing notes.   HISTORY  Chief Complaint Ear Problem    HPI Colin Glass is a 34 y.o. male presents emergency department complaint of a Q-tip being stuck in his left ear.  Also complaining of allergy symptoms.  Patient was taking Claritin and start taking Zyrtec as the Claritin did not work.  No fever or chills.  No cough or congestion.    Past Medical History:  Diagnosis Date  . GERD (gastroesophageal reflux disease)     There are no problems to display for this patient.   Past Surgical History:  Procedure Laterality Date  . DENTAL SURGERY    . MOUTH SURGERY      Prior to Admission medications   Medication Sig Start Date End Date Taking? Authorizing Provider  cetirizine (ZYRTEC) 10 MG tablet Take 10 mg by mouth daily.   Yes [provider]  fluticasone (FLONASE) 50 MCG/ACT nasal spray Place 2 sprays into both nostrils daily. 10/21/17  Yes Menshew, Dannielle Karvonen, PA-C  pantoprazole (PROTONIX) 40 MG tablet Take 1 tablet (40 mg total) by mouth daily for 21 days. 06/19/19 09/01/19 Yes Gavriela Cashin, Linden Dolin, PA-C  albuterol (PROVENTIL HFA;VENTOLIN HFA) 108 (90 Base) MCG/ACT inhaler Inhale 2 puffs into the lungs every 6 (six) hours as needed for wheezing or shortness of breath. 10/21/17   Menshew, Dannielle Karvonen, PA-C  ibuprofen (ADVIL) 800 MG tablet Take 1 tablet (800 mg total) by mouth every 8 (eight) hours as needed. 07/16/19   Gregor Hams, MD    Allergies Patient has no known allergies.  History reviewed. No pertinent family history.  Social History Social History   Tobacco Use  . Smoking status: Former Smoker    Packs/day: 0.50    Types: Cigarettes    Quit date: 10/21/2014    Years since quitting: 4.8  . Smokeless  tobacco: Never Used  . Tobacco comment: pt reports currently smoking 1 black and mild a day  Substance Use Topics  . Alcohol use: Yes    Comment: socially  . Drug use: Yes    Types: Marijuana    Review of Systems  Constitutional: No fever/chills Eyes: No visual changes. ENT: No sore throat.  Positive postnasal drip body in left ear Respiratory: Denies cough Cardiovascular: Denies chest pain Gastrointestinal: Denies abdominal pain Genitourinary: Negative for dysuria. Musculoskeletal: Negative for back pain. Skin: Negative for rash. Psychiatric: no mood changes,     ____________________________________________   PHYSICAL EXAM:  VITAL SIGNS: ED Triage Vitals  Enc Vitals Group     BP 09/01/19 1041 131/80     Pulse Rate 09/01/19 1041 90     Resp 09/01/19 1041 18     Temp 09/01/19 1041 98.1 F (36.7 C)     Temp Source 09/01/19 1041 Oral     SpO2 09/01/19 1041 98 %     Weight 09/01/19 1038 180 lb (81.6 kg)     Height 09/01/19 1038 6\' 1"  (1.854 m)     Head Circumference --      Peak Flow --      Pain Score 09/01/19 1038 7     Pain Loc --      Pain Edu? --      Excl. in [place]? --     Constitutional: Alert  and oriented. Well appearing and in no acute distress. Eyes: Conjunctivae are normal.  Head: Atraumatic. Ears: Left ear canal has a piece of white cotton Nose: No congestion/rhinnorhea. Mouth/Throat: Mucous membranes are moist.   Neck:  supple no lymphadenopathy noted Cardiovascular: Normal rate, regular rhythm. Heart sounds are normal Respiratory: Normal respiratory effort.  No retractions, lungs c t a  GU: deferred Musculoskeletal: FROM all extremities, warm and well perfused Neurologic:  Normal speech and language.  Skin:  Skin is warm, dry and intact. No rash noted. Psychiatric: Mood and affect are normal. Speech and behavior are normal.  ____________________________________________   LABS (all labs ordered are listed, but only abnormal results are  displayed)  Labs Reviewed - No data to display ____________________________________________   ____________________________________________  RADIOLOGY    ____________________________________________   PROCEDURES  Procedure(s) performed:   .Foreign Body Removal  Date/Time: 09/01/2019 11:07 AM Performed by: Versie Starks, PA-C Authorized by: Versie Starks, PA-C  Consent: Verbal consent obtained. Consent given by: patient Body area: ear Location details: left ear  Sedation: Patient sedated: no  Localization method: ENT speculum Removal mechanism: alligator forceps Complexity: simple 1 objects recovered. Objects recovered: cotton end of q tip Post-procedure assessment: foreign body removed      ____________________________________________   INITIAL IMPRESSION / ASSESSMENT AND PLAN / ED COURSE  Pertinent labs & imaging results that were available during my care of the patient were reviewed by me and considered in my medical decision making (see chart for details).   Patient is a 34 year old male presents emergency department with concerns of foreign body left ear and seasonal allergies problems.  See HPI  Physical exam shows a piece of cotton to be in the left ear.  See procedure note for removal.  TM is intact post foreign body removal.  No abrasion noted to the ear canal.  Remainder the exam is unremarkable  Explained everything to the patient.  He does not use Q-tips.  He is to use over-the-counter Allegra for his allergy symptoms.  He states he understands will comply.  Is discharged stable condition.    Colin Glass was evaluated in Emergency Department on 09/01/2019 for the symptoms described in the history of present illness. He was evaluated in the context of the global COVID-19 pandemic, which necessitated consideration that the patient might be at risk for infection with the SARS-CoV-2 virus that causes COVID-19. Institutional protocols and algorithms  that pertain to the evaluation of patients at risk for COVID-19 are in a state of rapid change based on information released by regulatory bodies including the CDC and federal and state organizations. These policies and algorithms were followed during the patient's care in the ED.   As part of my medical decision making, I reviewed the following data within the [place] notes reviewed and incorporated, Old chart reviewed, Notes from prior ED visits and [place] Controlled Substance Database  ____________________________________________   FINAL CLINICAL IMPRESSION(S) / ED DIAGNOSES  Final diagnoses:  Ear foreign body, left, initial encounter      NEW MEDICATIONS STARTED DURING THIS VISIT:  New Prescriptions   No medications on file     Note:  This document was prepared using Dragon voice recognition software and may include unintentional dictation errors.    Versie Starks, PA-C 09/01/19 1109    Harvest Dark, MD 09/01/19 [phone]

## 2019-09-01 NOTE — ED Notes (Signed)
Yesterday was cleaning ear and the cotton came off and it is in left ear.  No pain.

## 2019-09-01 NOTE — ED Triage Notes (Signed)
Patient states he was cleaning his left ear with a q-tip yesterday and part of the q-tip, "got stuck" in his left ear.  Patient states his ear now, "feels weird."  Patient states he would also like to be seen for his "allergies" (sneezing, congestion and watery eyes).  Patient states, "I have this year round."  Patient is in no obvious distress at this time.

## 2020-05-01 ENCOUNTER — Emergency Department: Payer: 59

## 2020-05-01 ENCOUNTER — Other Ambulatory Visit: Payer: Self-pay

## 2020-05-01 ENCOUNTER — Emergency Department
Admission: EM | Admit: 2020-05-01 | Discharge: 2020-05-01 | Disposition: A | Discharge status: Home / Self Care | Payer: 59 | Attending: Emergency Medicine | Admitting: Emergency Medicine

## 2020-05-01 ENCOUNTER — Encounter: Payer: Self-pay | Admitting: Emergency Medicine

## 2020-05-01 DIAGNOSIS — R059 Cough, unspecified: Secondary | ICD-10-CM | POA: Diagnosis present

## 2020-05-01 DIAGNOSIS — U071 COVID-19: Secondary | ICD-10-CM | POA: Diagnosis not present

## 2020-05-01 DIAGNOSIS — Z87891 Personal history of nicotine dependence: Secondary | ICD-10-CM | POA: Diagnosis not present

## 2020-05-01 LAB — RESP PANEL BY RT-PCR (FLU A&B, COVID) ARPGX2
Influenza A by PCR: NEGATIVE
Influenza B by PCR: NEGATIVE
SARS Coronavirus 2 by RT PCR: POSITIVE — AB

## 2020-05-01 MED ORDER — BENZONATATE 100 MG PO CAPS
100.0000 mg | ORAL_CAPSULE | Freq: Once | ORAL | Status: AC
  Administered 2020-05-01: 100 mg via ORAL
  Filled 2020-05-01: qty 1

## 2020-05-01 MED ORDER — BENZONATATE 100 MG PO CAPS: 100.0000 mg | ORAL_CAPSULE | Freq: Three times a day (TID) | ORAL | 0 refills | Status: DC | PRN

## 2020-05-01 NOTE — ED Notes (Signed)
Pt given cup of water 

## 2020-05-01 NOTE — ED Triage Notes (Signed)
Pt to ER with c/o cough productive of green sputum, body aches, chills and sore throat for last 4 days.

## 2020-05-02 ENCOUNTER — Telehealth (HOSPITAL_COMMUNITY): Payer: Self-pay

## 2020-05-02 NOTE — Telephone Encounter (Signed)
Called to Discuss with patient about Covid symptoms and the use of the monoclonal antibody infusion for those with mild to moderate Covid symptoms and at a high risk of hospitalization.     Pt appears to qualify for this infusion due to co-morbid conditions and/or a member of an at-risk group in accordance with the FDA Emergency Use Authorization.    Unable to reach pt   Left VM message to return call at [phone] if interested in monoclonal antibody treatment.

## 2020-05-02 NOTE — ED Provider Notes (Signed)
[facility] Emergency Department Provider Note  ____________________________________________   Event Date/Time   First MD Initiated Contact with Patient 05/01/20 2017     (approximate)  I have reviewed the triage vital signs and the nursing notes.   HISTORY  Chief Complaint Cough, Sore Throat, and Chills  HPI Colin Glass is a 34 y.o. male who reports to the emergency department for evaluation of productive cough, body aches, chills and sore throat this been present for the last 4 days.  The patient states that his sister and her kids are Covid positive, and he has been around them a significant amount of time.  He denies any chest pain, shortness of breath, or abdominal symptoms.  He has had an intermittent fever, though has not had it today.  He has not attempted any alleviating factors to this point.       Past Medical History:  Diagnosis Date  . GERD (gastroesophageal reflux disease)     There are no problems to display for this patient.   Past Surgical History:  Procedure Laterality Date  . DENTAL SURGERY    . MOUTH SURGERY      Prior to Admission medications   Medication Sig Start Date End Date Taking? Authorizing Provider  albuterol (PROVENTIL HFA;VENTOLIN HFA) 108 (90 Base) MCG/ACT inhaler Inhale 2 puffs into the lungs every 6 (six) hours as needed for wheezing or shortness of breath. 10/21/17   Menshew, Charlesetta Ivory, PA-C  benzonatate (TESSALON PERLES) 100 MG capsule Take 1 capsule (100 mg total) by mouth 3 (three) times daily as needed for cough. 05/01/20 05/01/21  Lucy Chris, PA  cetirizine (ZYRTEC) 10 MG tablet Take 10 mg by mouth daily.    [provider]  fluticasone (FLONASE) 50 MCG/ACT nasal spray Place 2 sprays into both nostrils daily. 10/21/17   Menshew, Charlesetta Ivory, PA-C  ibuprofen (ADVIL) 800 MG tablet Take 1 tablet (800 mg total) by mouth every 8 (eight) hours as needed. 07/16/19   Darci Current, MD   pantoprazole (PROTONIX) 40 MG tablet Take 1 tablet (40 mg total) by mouth daily for 21 days. 06/19/19 09/01/19  Faythe Ghee, PA-C    Allergies Patient has no known allergies.  History reviewed. No pertinent family history.  Social History Social History   Tobacco Use  . Smoking status: Former Smoker    Packs/day: 0.50    Types: Cigarettes    Quit date: 10/21/2014    Years since quitting: 5.5  . Smokeless tobacco: Never Used  . Tobacco comment: pt reports currently smoking 1 black and mild a day  Vaping Use  . Vaping Use: Never used  Substance Use Topics  . Alcohol use: Yes    Comment: socially  . Drug use: Yes    Types: Marijuana    Review of Systems Constitutional: + fever/chills, + body aches Eyes: No visual changes. ENT: + sore throat. Cardiovascular: Denies chest pain. Respiratory: + Cough, denies shortness of breath. Gastrointestinal: No abdominal pain.  No nausea, no vomiting.  No diarrhea.  No constipation. Genitourinary: Negative for dysuria. Musculoskeletal: Negative for back pain. Skin: Negative for rash. Neurological: Negative for headaches, focal weakness or numbness.  ____________________________________________   PHYSICAL EXAM:  VITAL SIGNS: ED Triage Vitals  Enc Vitals Group     BP 05/01/20 1716 (!) 141/89     Pulse Rate 05/01/20 1716 75     Resp 05/01/20 1716 18     Temp 05/01/20 1716  97.9 F (36.6 C)     Temp Source 05/01/20 1716 Oral     SpO2 05/01/20 1716 96 %     Weight 05/01/20 1717 185 lb (83.9 kg)     Height 05/01/20 1717 6\' 1"  (1.854 m)     Head Circumference --      Peak Flow --      Pain Score 05/01/20 1716 0     Pain Loc --      Pain Edu? --      Excl. in [place]? --    Constitutional: Alert and oriented. Well appearing and in no acute distress. Eyes: Conjunctivae are normal. PERRL. EOMI. Head: Atraumatic. Nose: Mild congestion/rhinnorhea. Mouth/Throat: Mucous membranes are moist.  Oropharynx erythematous without any  tonsillar enlargement or exudate. Neck: No stridor.   Lymphatic: No cervical lymphadenopathy Cardiovascular: Normal rate, regular rhythm. Grossly normal heart sounds.  Good peripheral circulation. Respiratory: Normal respiratory effort.  No retractions.  Coarse breath sounds throughout both lungs, mild expiratory wheezing in the bases.  No rhonchi or crackles. Gastrointestinal: Soft and nontender. No distention. No abdominal bruits. No CVA tenderness. Musculoskeletal: No lower extremity tenderness nor edema.  No joint effusions. Neurologic:  Normal speech and language. No gross focal neurologic deficits are appreciated. No gait instability. Skin:  Skin is warm, dry and intact. No rash noted. Psychiatric: Mood and affect are normal. Speech and behavior are normal.  ____________________________________________   LABS (all labs ordered are listed, but only abnormal results are displayed)  Labs Reviewed  RESP PANEL BY RT-PCR (FLU A&B, COVID) ARPGX2 - Abnormal; Notable for the following components:      Result Value   SARS Coronavirus 2 by RT PCR POSITIVE (*)    All other components within normal limits   ____________________________________________  RADIOLOGY I, Marlana Salvage, personally viewed and evaluated these images (plain radiographs) as part of my medical decision making, as well as reviewing the written report by the radiologist.  ED provider interpretation: No focal pneumonia  Official radiology report(s): DG Chest Portable 1 View  Result Date: 05/01/2020 CLINICAL DATA:  Fever and cough, COVID-19 positive. EXAM: PORTABLE CHEST 1 VIEW COMPARISON:  06/18/2018 FINDINGS: Lungs are adequately inflated without focal airspace consolidation or effusion. Cardiomediastinal silhouette, bones and soft tissues are normal. IMPRESSION: No active disease. Electronically Signed   By: Marin Olp M.D.   On: 05/01/2020 20:36    ____________________________________________   INITIAL  IMPRESSION / ASSESSMENT AND PLAN / ED COURSE  As part of my medical decision making, I reviewed the following data within the [place] notes reviewed and incorporated, Labs reviewed and Radiograph reviewed        Patient is a 34 year old male who presents to the emergency department for evaluation of cough, sore throat, body aches and chills after known Covid exposure.  On physical exam, the patient looks overall well, does have an erythematous pharynx and expiratory wheezing in the bilateral lung bases.  No focal pneumonia appreciated on chest x-ray.  The patient did have a respiratory panel in our facility and is positive for Covid.  Symptomatic treatment was discussed with the patient.  He will be prescribed Tessalon Perles for treatment of his cough.  He was encouraged to stay away from the general public, and quarantine appropriately.  Patient was instructed on return precautions of when to return to the emergency department.  He is amenable with this plan is stable at this time for outpatient therapy.  ____________________________________________   FINAL CLINICAL IMPRESSION(S) / ED DIAGNOSES  Final diagnoses:  COVID     ED Discharge Orders         Ordered    benzonatate (TESSALON PERLES) 100 MG capsule  3 times daily PRN        05/01/20 2042          *Please note:  Colin Glass was evaluated in Emergency Department on 05/02/2020 for the symptoms described in the history of present illness. He was evaluated in the context of the global COVID-19 pandemic, which necessitated consideration that the patient might be at risk for infection with the SARS-CoV-2 virus that causes COVID-19. Institutional protocols and algorithms that pertain to the evaluation of patients at risk for COVID-19 are in a state of rapid change based on information released by regulatory bodies including the CDC and federal and state organizations. These policies and algorithms  were followed during the patient's care in the ED.  Some ED evaluations and interventions may be delayed as a result of limited staffing during and the pandemic.*   Note:  This document was prepared using Dragon voice recognition software and may include unintentional dictation errors.    Lucy Chris, PA 05/02/20 1521    Delton [place], MD 05/02/20 2352

## 2020-05-21 ENCOUNTER — Encounter: Payer: Self-pay | Admitting: Emergency Medicine

## 2020-05-21 ENCOUNTER — Other Ambulatory Visit: Payer: Self-pay

## 2020-05-21 ENCOUNTER — Emergency Department
Admission: EM | Admit: 2020-05-21 | Discharge: 2020-05-21 | Disposition: A | Discharge status: Home / Self Care | Payer: 59 | Attending: Emergency Medicine | Admitting: Emergency Medicine

## 2020-05-21 DIAGNOSIS — R590 Localized enlarged lymph nodes: Secondary | ICD-10-CM | POA: Insufficient documentation

## 2020-05-21 DIAGNOSIS — R591 Generalized enlarged lymph nodes: Secondary | ICD-10-CM

## 2020-05-21 DIAGNOSIS — L739 Follicular disorder, unspecified: Secondary | ICD-10-CM | POA: Insufficient documentation

## 2020-05-21 DIAGNOSIS — F17290 Nicotine dependence, other tobacco product, uncomplicated: Secondary | ICD-10-CM | POA: Insufficient documentation

## 2020-05-21 MED ORDER — SULFAMETHOXAZOLE-TRIMETHOPRIM 800-160 MG PO TABS: 1.0000 | ORAL_TABLET | Freq: Two times a day (BID) | ORAL | 0 refills | Status: DC

## 2020-05-21 MED ORDER — NAPROXEN 500 MG PO TABS: 500.0000 mg | ORAL_TABLET | Freq: Two times a day (BID) | ORAL | Status: DC

## 2020-05-21 NOTE — Discharge Instructions (Signed)
Follow discharge care instructions take medications as directed. Use antibacterial soap twice a day as directed.

## 2020-05-21 NOTE — ED Provider Notes (Signed)
[facility] Emergency Department Provider Note   ____________________________________________   Event Date/Time   First MD Initiated Contact with Patient 05/21/20 1152     (approximate)  I have reviewed the triage vital signs and the nursing notes.   HISTORY  Chief Complaint Abscess    HPI Colin Glass is a 35 y.o. male patient presents with bumps/abscess facial area. Patient states this is a recurrent complaint. However he has noticed swollen lymph nodes lateral posterior auricle area. He denies fever or drainage.The patient also states that he was exposed to trichomonas and would like STD test. States asymptomatic.          Past Medical History:  Diagnosis Date  . GERD (gastroesophageal reflux disease)     There are no problems to display for this patient.   Past Surgical History:  Procedure Laterality Date  . DENTAL SURGERY    . MOUTH SURGERY      Prior to Admission medications   Medication Sig Start Date End Date Taking? Authorizing Provider  naproxen (NAPROSYN) 500 MG tablet Take 1 tablet (500 mg total) by mouth 2 (two) times daily with a meal. 05/21/20  Yes Sable Feil, PA-C  sulfamethoxazole-trimethoprim (BACTRIM DS) 800-160 MG tablet Take 1 tablet by mouth 2 (two) times daily. 05/21/20  Yes Sable Feil, PA-C  albuterol (PROVENTIL HFA;VENTOLIN HFA) 108 (90 Base) MCG/ACT inhaler Inhale 2 puffs into the lungs every 6 (six) hours as needed for wheezing or shortness of breath. 10/21/17   Menshew, Dannielle Karvonen, PA-C  benzonatate (TESSALON PERLES) 100 MG capsule Take 1 capsule (100 mg total) by mouth 3 (three) times daily as needed for cough. 05/01/20 05/01/21  Marlana Salvage, PA  cetirizine (ZYRTEC) 10 MG tablet Take 10 mg by mouth daily.    [provider]  fluticasone (FLONASE) 50 MCG/ACT nasal spray Place 2 sprays into both nostrils daily. 10/21/17   Menshew, Dannielle Karvonen, PA-C  ibuprofen (ADVIL) 800 MG tablet Take  1 tablet (800 mg total) by mouth every 8 (eight) hours as needed. 07/16/19   Gregor Hams, MD  pantoprazole (PROTONIX) 40 MG tablet Take 1 tablet (40 mg total) by mouth daily for 21 days. 06/19/19 09/01/19  Versie Starks, PA-C    Allergies Patient has no known allergies.  No family history on file.  Social History Social History   Tobacco Use  . Smoking status: Former Smoker    Packs/day: 0.50    Types: Cigarettes    Quit date: 10/21/2014    Years since quitting: 5.5  . Smokeless tobacco: Never Used  . Tobacco comment: pt reports currently smoking 1 black and mild a day  Vaping Use  . Vaping Use: Never used  Substance Use Topics  . Alcohol use: Yes    Comment: socially  . Drug use: Yes    Types: Marijuana    Review of Systems Constitutional: No fever/chills Eyes: No visual changes. ENT: No sore throat. Cardiovascular: Denies chest pain. Respiratory: Denies shortness of breath. Gastrointestinal: No abdominal pain.  No nausea, no vomiting.  No diarrhea.  No constipation. Genitourinary: Negative for dysuria. Musculoskeletal: Negative for back pain. Skin: Positive for rash. Neurological: Negative for headaches, focal weakness or numbness.  ____________________________________________   PHYSICAL EXAM:  VITAL SIGNS: ED Triage Vitals  Enc Vitals Group     BP 05/21/20 1059 115/79     Pulse Rate 05/21/20 1059 88     Resp 05/21/20 1059 20  Temp 05/21/20 1059 98.2 F (36.8 C)     Temp Source 05/21/20 1059 Oral     SpO2 05/21/20 1059 98 %     Weight 05/21/20 1052 180 lb (81.6 kg)     Height 05/21/20 1052 6\' 1"  (1.854 m)     Head Circumference --      Peak Flow --      Pain Score 05/21/20 1052 0     Pain Loc --      Pain Edu? --      Excl. in [place]? --    Constitutional: Alert and oriented. Well appearing and in no acute distress. Nose: No congestion/rhinnorhea. Mouth/Throat: Mucous membranes are moist.  Oropharynx  non-erythematous. Hematological/Lymphatic/Immunilogical: Bilateral auricle and cervical lymphadenopathy. Cardiovascular: Normal rate, regular rhythm. Grossly normal heart sounds.  Good peripheral circulation. Respiratory: Normal respiratory effort.  No retractions. Lungs CTAB. Musculoskeletal: No lower extremity tenderness nor edema.  No joint effusions. Neurologic:  Normal speech and language. No gross focal neurologic deficits are appreciated. No gait instability. Skin:  Skin is warm, dry and intact. Multiple papular lesions facial area.  Psychiatric: Mood and affect are normal. Speech and behavior are normal.  ____________________________________________   LABS (all labs ordered are listed, but only abnormal results are displayed)  Labs Reviewed - No data to display ____________________________________________  EKG   ____________________________________________  RADIOLOGY I, Sable Feil, personally viewed and evaluated these images (plain radiographs) as part of my medical decision making, as well as reviewing the written report by the radiologist.  ED MD interpretation:    Official radiology report(s): No results found.  ____________________________________________   PROCEDURES  Procedure(s) performed (including Critical Care):  Procedures   ____________________________________________   INITIAL IMPRESSION / ASSESSMENT AND PLAN / ED COURSE  As part of my medical decision making, I reviewed the following data within the [place]         Patient presents with facial rash and swollen lymph nodes lateral cervical area and bilateral auricle area. Patient physical exam is consistent with folliculitis and lymphadenopathy. Patient given discharge care instructions. Patient advised to use antibacterial soap and take medication as directed. Patient advised to follow-up with [facility] health department to address his STD complaint.        ____________________________________________   FINAL CLINICAL IMPRESSION(S) / ED DIAGNOSES  Final diagnoses:  Folliculitis  Lymphadenopathy     ED Discharge Orders         Ordered    sulfamethoxazole-trimethoprim (BACTRIM DS) 800-160 MG tablet  2 times daily        05/21/20 1208    naproxen (NAPROSYN) 500 MG tablet  2 times daily with meals        05/21/20 1208          *Please note:  Colin Glass was evaluated in Emergency Department on 05/21/2020 for the symptoms described in the history of present illness. He was evaluated in the context of the global COVID-19 pandemic, which necessitated consideration that the patient might be at risk for infection with the SARS-CoV-2 virus that causes COVID-19. Institutional protocols and algorithms that pertain to the evaluation of patients at risk for COVID-19 are in a state of rapid change based on information released by regulatory bodies including the CDC and federal and state organizations. These policies and algorithms were followed during the patient's care in the ED.  Some ED evaluations and interventions may be delayed as a result of limited staffing during and the pandemic.*  Note:  This document was prepared using Dragon voice recognition software and may include unintentional dictation errors.    Sable Feil, PA-C 05/21/20 1221    Blake Divine, MD 05/22/20 [phone]

## 2020-05-21 NOTE — ED Triage Notes (Signed)
Pt reports small abscess or bumps that are coming up on his face. Pt states they came up before but went away and this time they hurt to touch

## 2020-07-28 ENCOUNTER — Other Ambulatory Visit: Payer: Self-pay

## 2020-07-28 ENCOUNTER — Emergency Department
Admission: EM | Admit: 2020-07-28 | Discharge: 2020-07-28 | Disposition: A | Discharge status: Home / Self Care | Payer: Self-pay | Attending: Emergency Medicine | Admitting: Emergency Medicine

## 2020-07-28 ENCOUNTER — Encounter: Payer: Self-pay | Admitting: Physician Assistant

## 2020-07-28 DIAGNOSIS — J069 Acute upper respiratory infection, unspecified: Secondary | ICD-10-CM | POA: Insufficient documentation

## 2020-07-28 DIAGNOSIS — J029 Acute pharyngitis, unspecified: Secondary | ICD-10-CM | POA: Insufficient documentation

## 2020-07-28 DIAGNOSIS — Z87891 Personal history of nicotine dependence: Secondary | ICD-10-CM | POA: Insufficient documentation

## 2020-07-28 LAB — GROUP A STREP BY PCR: Group A Strep by PCR: NOT DETECTED

## 2020-07-28 MED ORDER — PANTOPRAZOLE SODIUM 40 MG PO TBEC: 40.0000 mg | DELAYED_RELEASE_TABLET | Freq: Every day | ORAL | 1 refills | Status: DC

## 2020-07-28 NOTE — ED Provider Notes (Signed)
[facility] Emergency Department Provider Note ____________________________________________  Time seen: 2158  I have reviewed the triage vital signs and the nursing notes.  HISTORY  Chief Complaint  Sore Throat  HPI Colin Glass is a 35 y.o. male presents to the ED for evaluation of 3 days of worsening sore throat.  He would admit however, that he has had nearly a week and a half of overall sore throat.  He denies any interim fevers, nausea, or vomiting.    Past Medical History:  Diagnosis Date  . GERD (gastroesophageal reflux disease)     There are no problems to display for this patient.   Past Surgical History:  Procedure Laterality Date  . DENTAL SURGERY    . MOUTH SURGERY      Prior to Admission medications   Medication Sig Start Date End Date Taking? Authorizing Provider  albuterol (PROVENTIL HFA;VENTOLIN HFA) 108 (90 Base) MCG/ACT inhaler Inhale 2 puffs into the lungs every 6 (six) hours as needed for wheezing or shortness of breath. 10/21/17   Darreon Lutes, Dannielle Karvonen, PA-C  cetirizine (ZYRTEC) 10 MG tablet Take 10 mg by mouth daily.    [provider]  fluticasone (FLONASE) 50 MCG/ACT nasal spray Place 2 sprays into both nostrils daily. 10/21/17   Azel Gumina, Dannielle Karvonen, PA-C  pantoprazole (PROTONIX) 40 MG tablet Take 1 tablet (40 mg total) by mouth daily. 07/28/20 09/26/20  Markiesha Delia, Dannielle Karvonen, PA-C    Allergies Patient has no known allergies.  History reviewed. No pertinent family history.  Social History Social History   Tobacco Use  . Smoking status: Former Smoker    Packs/day: 0.50    Types: Cigarettes    Quit date: 10/21/2014    Years since quitting: 5.7  . Smokeless tobacco: Never Used  . Tobacco comment: pt reports currently smoking 1 black and mild a day  Vaping Use  . Vaping Use: Never used  Substance Use Topics  . Alcohol use: Yes    Comment: socially  . Drug use: Yes    Types: Marijuana    Review of  Systems  Constitutional: Negative for fever. Eyes: Negative for visual changes. ENT: Positive for sore throat. Cardiovascular: Negative for chest pain. Respiratory: Negative for shortness of breath. Gastrointestinal: Negative for abdominal pain, vomiting and diarrhea. Genitourinary: Negative for dysuria. Musculoskeletal: Negative for back pain. Skin: Negative for rash. Neurological: Negative for headaches, focal weakness or numbness. ____________________________________________  PHYSICAL EXAM:  VITAL SIGNS: ED Triage Vitals  Enc Vitals Group     BP 07/28/20 2122 121/74     Pulse Rate 07/28/20 2120 88     Resp 07/28/20 2120 18     Temp 07/28/20 2120 98.3 F (36.8 C)     Temp Source 07/28/20 2120 Oral     SpO2 07/28/20 2120 99 %     Weight 07/28/20 2120 185 lb (83.9 kg)     Height 07/28/20 2120 6\' 2"  (1.88 m)     Head Circumference --      Peak Flow --      Pain Score 07/28/20 2120 9     Pain Loc --      Pain Edu? --      Excl. in [place]? --     Constitutional: Alert and oriented. Well appearing and in no distress. Head: Normocephalic and atraumatic. Eyes: Conjunctivae are normal. PERRL. Normal extraocular movements Ears: Canals clear. TMs intact bilaterally. Nose: No congestion/rhinorrhea/epistaxis. Mouth/Throat: Mucous membranes are moist.  Uvula  is midline and tonsils are flat.  No oropharyngeal erythema is appreciated.  No tonsillar exudates noted. Neck: Supple. No thyromegaly. Hematological/Lymphatic/Immunological: No cervical lymphadenopathy. Cardiovascular: Normal rate, regular rhythm. Normal distal pulses. Respiratory: Normal respiratory effort. No wheezes/rales/rhonchi. Gastrointestinal: Soft and nontender. No distention. Musculoskeletal: Nontender with normal range of motion in all extremities.  Neurologic:  Normal gait without ataxia. Normal speech and language. No gross focal neurologic deficits are appreciated. Skin:  Skin is warm, dry and intact. No rash  noted. Psychiatric: Mood and affect are normal. Patient exhibits appropriate insight and judgment. ____________________________________________   LABS (pertinent positives/negatives) Labs Reviewed  GROUP A STREP BY PCR  ____________________________________________  PROCEDURES  Procedures ____________________________________________  INITIAL IMPRESSION / ASSESSMENT AND PLAN / ED COURSE  DDX: strep throat, viral sore throat, GERD  Patient ED evaluation of a 1 week complaint of intermittent sore throat without fevers.  Exam is benign reassuring as he has no signs of airway obstruction.  Strep test is negative at this time.  Patient will be treated empirically for GERD with a prescription for pantoprazole.  He will follow-up with his primary provider or local urgent care.  Return precautions have been discussed.   Colin Glass was evaluated in Emergency Department on 07/28/2020 for the symptoms described in the history of present illness. He was evaluated in the context of the global COVID-19 pandemic, which necessitated consideration that the patient might be at risk for infection with the SARS-CoV-2 virus that causes COVID-19. Institutional protocols and algorithms that pertain to the evaluation of patients at risk for COVID-19 are in a state of rapid change based on information released by regulatory bodies including the CDC and federal and state organizations. These policies and algorithms were followed during the patient's care in the ED. ___________________________________________  FINAL CLINICAL IMPRESSION(S) / ED DIAGNOSES  Final diagnoses:  Sore throat  Viral URI with cough      Dontai Pember, Dannielle Karvonen, PA-C 07/28/20 2335    Nance Pear, MD 07/29/20 1711

## 2020-07-28 NOTE — ED Triage Notes (Signed)
Pt in with co sore throat for 3 days, denies any fever.

## 2020-07-28 NOTE — Discharge Instructions (Signed)
Your strep test is negative for any signs of infection.  Sore throat may be due to poorly controlled reflux disease.  Take the stomach acid pump medicine as directed.  Follow-up with your primary provider or local urgent care for ongoing care.

## 2021-04-09 ENCOUNTER — Encounter: Payer: Self-pay | Admitting: Emergency Medicine

## 2021-04-09 ENCOUNTER — Emergency Department
Admission: EM | Admit: 2021-04-09 | Discharge: 2021-04-09 | Disposition: A | Discharge status: Home / Self Care | Payer: Medicaid Other | Attending: Emergency Medicine | Admitting: Emergency Medicine

## 2021-04-09 ENCOUNTER — Other Ambulatory Visit: Payer: Self-pay

## 2021-04-09 DIAGNOSIS — Z5321 Procedure and treatment not carried out due to patient leaving prior to being seen by health care provider: Secondary | ICD-10-CM | POA: Insufficient documentation

## 2021-04-09 DIAGNOSIS — R251 Tremor, unspecified: Secondary | ICD-10-CM | POA: Insufficient documentation

## 2021-04-09 NOTE — ED Notes (Signed)
Pt called x's 3, no response ?

## 2021-04-09 NOTE — ED Triage Notes (Signed)
Pt called from [facility] to treatment room, no response ?

## 2021-04-09 NOTE — ED Triage Notes (Addendum)
Pt comes into the ED via POV c/o shaking and cold chills.  Pt denies any known fevers, cough, or sore throat.  Pt states he woke up shaking and having cold sweats.  Pt thinks it is anxiety driven but denies any h/o anxiety.  Pt in NAD at this time with even and unlabored respirations.  Pt presents afebrile. Pt does admit he drank heavily yesterday due to it being his birthday.

## 2021-04-09 NOTE — ED Triage Notes (Signed)
Pt called x's 3, no response ?

## 2021-04-09 NOTE — ED Triage Notes (Signed)
Pt called from [facility] to treatment room, no response 

## 2021-04-11 ENCOUNTER — Other Ambulatory Visit: Payer: Self-pay

## 2021-04-11 ENCOUNTER — Emergency Department
Admission: EM | Admit: 2021-04-11 | Discharge: 2021-04-11 | Disposition: A | Discharge status: Home / Self Care | Payer: Medicaid Other | Attending: Emergency Medicine | Admitting: Emergency Medicine

## 2021-04-11 DIAGNOSIS — Z87891 Personal history of nicotine dependence: Secondary | ICD-10-CM | POA: Insufficient documentation

## 2021-04-11 DIAGNOSIS — J111 Influenza due to unidentified influenza virus with other respiratory manifestations: Secondary | ICD-10-CM

## 2021-04-11 DIAGNOSIS — R0981 Nasal congestion: Secondary | ICD-10-CM | POA: Insufficient documentation

## 2021-04-11 DIAGNOSIS — R059 Cough, unspecified: Secondary | ICD-10-CM | POA: Insufficient documentation

## 2021-04-11 DIAGNOSIS — Z20822 Contact with and (suspected) exposure to covid-19: Secondary | ICD-10-CM | POA: Insufficient documentation

## 2021-04-11 LAB — RESP PANEL BY RT-PCR (FLU A&B, COVID) ARPGX2
Influenza A by PCR: NEGATIVE
Influenza B by PCR: NEGATIVE
SARS Coronavirus 2 by RT PCR: NEGATIVE

## 2021-04-11 NOTE — ED Triage Notes (Signed)
Pt comes with c/o cough fever and congestion for few days.

## 2021-04-11 NOTE — ED Provider Notes (Signed)
[facility] Emergency Department Provider Note   ____________________________________________    I have reviewed the triage vital signs and the nursing notes.   HISTORY  Chief Complaint Cough     HPI Colin Glass is a 35 y.o. male who presents with complaints of mild cough, congestion, occasional chills over the last 2 to 3 days.  He is concerned because one of his friends was recently diagnosed with COVID.  No shortness of breath  Past Medical History:  Diagnosis Date   GERD (gastroesophageal reflux disease)     There are no problems to display for this patient.   Past Surgical History:  Procedure Laterality Date   DENTAL SURGERY     MOUTH SURGERY      Prior to Admission medications   Medication Sig Start Date End Date Taking? Authorizing Provider  albuterol (PROVENTIL HFA;VENTOLIN HFA) 108 (90 Base) MCG/ACT inhaler Inhale 2 puffs into the lungs every 6 (six) hours as needed for wheezing or shortness of breath. 10/21/17   Menshew, Dannielle Karvonen, PA-C  cetirizine (ZYRTEC) 10 MG tablet Take 10 mg by mouth daily.    [provider]  fluticasone (FLONASE) 50 MCG/ACT nasal spray Place 2 sprays into both nostrils daily. 10/21/17   Menshew, Dannielle Karvonen, PA-C  pantoprazole (PROTONIX) 40 MG tablet Take 1 tablet (40 mg total) by mouth daily. 07/28/20 09/26/20  Menshew, Dannielle Karvonen, PA-C     Allergies Patient has no known allergies.  No family history on file.  Social History Social History   Tobacco Use   Smoking status: Former    Packs/day: 0.50    Types: Cigarettes    Quit date: 10/21/2014    Years since quitting: 6.4   Smokeless tobacco: Never   Tobacco comments:    pt reports currently smoking 1 black and mild a day  Vaping Use   Vaping Use: Never used  Substance Use Topics   Alcohol use: Yes    Comment: socially   Drug use: Yes    Types: Marijuana    Review of Systems  Constitutional: As above  ENT: As  above   Gastrointestinal: No abdominal pain.     Musculoskeletal: Positive myalgias Skin: Negative for rash. Neurological: Negative for headaches     ____________________________________________   PHYSICAL EXAM:  VITAL SIGNS: ED Triage Vitals  Enc Vitals Group     BP 04/11/21 1012 120/85     Pulse Rate 04/11/21 1012 86     Resp 04/11/21 1012 18     Temp 04/11/21 1012 98 F (36.7 C)     Temp Source 04/11/21 1111 Oral     SpO2 04/11/21 1012 97 %     Weight --      Height --      Head Circumference --      Peak Flow --      Pain Score 04/11/21 1012 5     Pain Loc --      Pain Edu? --      Excl. in [place]? --      Constitutional: Alert and oriented. No acute distress. Pleasant and interactive Eyes: Conjunctivae are normal.  Head: Atraumatic. Nose: No congestion/rhinnorhea. Mouth/Throat: Mucous membranes are moist.   Cardiovascular: Normal rate, regular rhythm.  Respiratory: Normal respiratory effort.  No retractions. Genitourinary: deferred Musculoskeletal: No lower extremity tenderness nor edema.   Neurologic:  Normal speech and language. No gross focal neurologic deficits are appreciated.   Skin:  Skin is warm, dry and intact. No rash noted.   ____________________________________________   LABS (all labs ordered are listed, but only abnormal results are displayed)  Labs Reviewed  RESP PANEL BY RT-PCR (FLU A&B, COVID) [id number]   ____________________________________________  EKG   ____________________________________________  RADIOLOGY   ____________________________________________   PROCEDURES  Procedure(s) performed: No  Procedures   Critical Care performed: No ____________________________________________   INITIAL IMPRESSION / ASSESSMENT AND PLAN / ED COURSE  Pertinent labs & imaging results that were available during my care of the patient were reviewed by me and considered in my medical decision making (see chart for details).   Patient  well-appearing and in no acute distress, unremarkable exam, symptoms consistent with viral illness, recommend supportive care.   ____________________________________________   FINAL CLINICAL IMPRESSION(S) / ED DIAGNOSES  Final diagnoses:  Influenza-like illness      NEW MEDICATIONS STARTED DURING THIS VISIT:  Discharge Medication List as of 04/11/2021 11:03 AM       Note:  This document was prepared using Dragon voice recognition software and may include unintentional dictation errors.    Lavonia Drafts, MD 04/11/21 [phone]

## 2021-07-06 ENCOUNTER — Ambulatory Visit: Payer: Self-pay | Admitting: Internal Medicine

## 2021-07-13 ENCOUNTER — Emergency Department
Admission: EM | Admit: 2021-07-13 | Discharge: 2021-07-13 | Disposition: A | Discharge status: Home / Self Care | Payer: BC Managed Care – PPO | Attending: Emergency Medicine | Admitting: Emergency Medicine

## 2021-07-13 ENCOUNTER — Encounter: Payer: Self-pay | Admitting: Emergency Medicine

## 2021-07-13 ENCOUNTER — Other Ambulatory Visit: Payer: Self-pay

## 2021-07-13 ENCOUNTER — Emergency Department: Payer: BC Managed Care – PPO

## 2021-07-13 DIAGNOSIS — R079 Chest pain, unspecified: Secondary | ICD-10-CM | POA: Diagnosis not present

## 2021-07-13 DIAGNOSIS — R0789 Other chest pain: Secondary | ICD-10-CM | POA: Insufficient documentation

## 2021-07-13 LAB — CBC
HCT: 44.9 % (ref 39.0–52.0)
Hemoglobin: 15 g/dL (ref 13.0–17.0)
MCH: 29.4 pg (ref 26.0–34.0)
MCHC: 33.4 g/dL (ref 30.0–36.0)
MCV: 87.9 fL (ref 80.0–100.0)
Platelets: 252 10*3/uL (ref 150–400)
RBC: 5.11 MIL/uL (ref 4.22–5.81)
RDW: 15.3 % (ref 11.5–15.5)
WBC: 6 10*3/uL (ref 4.0–10.5)
nRBC: 0 % (ref 0.0–0.2)

## 2021-07-13 LAB — TROPONIN I (HIGH SENSITIVITY)
Troponin I (High Sensitivity): <2 ng/L (ref <18)
Troponin I (High Sensitivity): <2 ng/L (ref <18)

## 2021-07-13 LAB — BASIC METABOLIC PANEL
Anion gap: 9 (ref 5–15)
BUN: 12 mg/dL (ref 6–20)
CO2: 27 mmol/L (ref 22–32)
Calcium: 9.3 mg/dL (ref 8.9–10.3)
Chloride: 100 mmol/L (ref 98–111)
Creatinine, Ser: 1.17 mg/dL (ref 0.61–1.24)
GFR, Estimated: >60 mL/min (ref >60)
Glucose, Bld: 100 mg/dL — ABNORMAL HIGH (ref 70–99)
Potassium: 3.9 mmol/L (ref 3.5–5.1)
Sodium: 136 mmol/L (ref 135–145)

## 2021-07-13 MED ORDER — KETOROLAC TROMETHAMINE 60 MG/2ML IM SOLN
60.0000 mg | Freq: Once | INTRAMUSCULAR | Status: DC
  Filled 2021-07-13: qty 2

## 2021-07-13 MED ORDER — NAPROXEN 375 MG PO TABS: 375.0000 mg | ORAL_TABLET | Freq: Two times a day (BID) | ORAL | 0 refills | Status: AC

## 2021-07-13 NOTE — ED Notes (Signed)
Pt gave verbal consent to Dc ? ?

## 2021-07-13 NOTE — ED Provider Notes (Signed)
? ?[facility] ?Provider Note ? ? ? Event Date/Time  ? First MD Initiated Contact with Patient 07/13/21 1519   ?  (approximate) ? ? ?History  ? ?Chest Pain ? ? ?HPI ? ?Colin Glass is a 36 y.o. male here with intermittent chest pain.  The patient states that for the last several days, he has had intermittent midline, upper chest pain.  Scribes as a stabbing sensation.  It is somewhat positional.  Feels like it is in his upper chest.  He is unclear what triggered it, though he does state that he did significant yard work the day prior to the onset.  He has also been gaming a lot and admits that he does not regularly sit in a chair and is in all positions, playing video games very often.  Otherwise, denies known history of cardiac disease.  He denies shortness of breath.  He did transiently have some left lateral chest pain, but is mostly in the midline now.  Does not radiate to the back.  No history of connective tissue disease.  No other complaints. ?  ? ? ?Physical Exam  ? ?Triage Vital Signs: ?ED Triage Vitals  ?Enc Vitals Group  ?   BP 07/13/21 1330 124/77  ?   Pulse Rate 07/13/21 1330 77  ?   Resp 07/13/21 1330 20  ?   Temp 07/13/21 1330 98.1 ?F (36.7 ?C)  ?   Temp Source 07/13/21 1330 Oral  ?   SpO2 07/13/21 1330 96 %  ?   Weight 07/13/21 1327 180 lb (81.6 kg)  ?   Height 07/13/21 1327 '6\' 2"'$  (1.88 m)  ?   Head Circumference --   ?   Peak Flow --   ?   Pain Score 07/13/21 1327 10  ?   Pain Loc --   ?   Pain Edu? --   ?   Excl. in [place]? --   ? ? ?Most recent vital signs: ?Vitals:  ? 07/13/21 1330 07/13/21 1730  ?BP: 124/77 126/78  ?Pulse: 77 80  ?Resp: 20 19  ?Temp: 98.1 ?F (36.7 ?C) 98.4 ?F (36.9 ?C)  ?SpO2: 96% 100%  ? ? ? ?General: Awake, no distress.  ?CV:  Good peripheral perfusion.  Regular rate and rhythm.  No murmurs.  2+ pulses bilaterally. ?Resp:  Normal effort.  Lungs clear to auscultation bilaterally.  No wheezes. ?Abd:  No distention.  No tenderness. ?Other:  Mild chest wall  tenderness with pinpoint tenderness in the intercostal spaces along the upper sternal border. ? ? ?ED Results / Procedures / Treatments  ? ?Labs ?(all labs ordered are listed, but only abnormal results are displayed) ?Labs Reviewed  ?BASIC METABOLIC PANEL - Abnormal; Notable for the following components:  ?    Result Value  ? Glucose, Bld 100 (*)   ? All other components within normal limits  ?CBC  ?TROPONIN I (HIGH SENSITIVITY)  ?TROPONIN I (HIGH SENSITIVITY)  ? ? ? ?EKG normal sinus rhythm, ventricular rate 82.  PR 178, QRS 86, QTc 397.  No acute ST elevations or depressions. ? ? ? ?RADIOLOGY ?CXR: No active disease ? ? ?I also independently reviewed and agree wit radiologist interpretations. ? ? ?PROCEDURES: ? ?Critical Care performed: No ? ? ?MEDICATIONS ORDERED IN ED: ?Medications  ?ketorolac (TORADOL) injection 60 mg (60 mg Intramuscular Patient Refused/Not Given 07/13/21 1732)  ? ? ? ?IMPRESSION / MDM / ASSESSMENT AND PLAN / ED COURSE  ?I reviewed the triage  vital signs and the nursing notes. ?             ?               ? ? ?The patient is on the cardiac monitor to evaluate for evidence of arrhythmia and/or significant heart rate changes. ? ? ?Ddx:  ?Musculoskeletal chest pain, GERD/gastritis, ACS, PE, dissection, PNA ? ? ?MDM:  ?36 yo well appearing male here with reproducible upper chest wall pain. Pt is well appearing and in NAD. EKG nonischemic, trop negative - do not suspect ACS. CXR is clear. No signs of PE. Pain is not concerning for dissection. Labs reviewed and are reassuring. CBC unremarkable. BMP with no significant abnormalities.  ? ?Given reproducibility of pain, correlation with increased physical activity and poor posture during gaming sessions per his report, suspect MSK chest wall pain/costochondritis. Will tx with NSAIDs, return precautions.  ? ? ? ?MEDICATIONS GIVEN IN ED: ?Medications  ?ketorolac (TORADOL) injection 60 mg (60 mg Intramuscular Patient Refused/Not Given 07/13/21 1732)   ? ? ? ?Consults:  ?None ? ? ?EMR reviewed  ?Prior ED visits ? ? ? ? ?FINAL CLINICAL IMPRESSION(S) / ED DIAGNOSES  ? ?Final diagnoses:  ?Chest wall pain  ? ? ? ?Rx / DC Orders  ? ?ED Discharge Orders   ? ?      Ordered  ?  naproxen (NAPROSYN) 375 MG tablet  2 times daily with meals       ? 07/13/21 1735  ? ?  ?  ? ?  ? ? ? ?Note:  This document was prepared using Dragon voice recognition software and may include unintentional dictation errors. ?  ?Duffy Bruce, MD ?07/13/21 2240 ? ?

## 2021-07-13 NOTE — ED Triage Notes (Signed)
Pt via POV from home. Pt c/o SOB and mid-sternal CP that started 2-3 days ago. None radiating. Pt is A&OX4 and NAD. Denies any cardiac hx.  ?

## 2021-07-20 ENCOUNTER — Other Ambulatory Visit: Payer: Self-pay

## 2021-07-20 ENCOUNTER — Encounter: Payer: Self-pay | Admitting: Emergency Medicine

## 2021-07-20 ENCOUNTER — Emergency Department
Admission: EM | Admit: 2021-07-20 | Discharge: 2021-07-20 | Disposition: A | Discharge status: Home / Self Care | Payer: BC Managed Care – PPO | Attending: Emergency Medicine | Admitting: Emergency Medicine

## 2021-07-20 ENCOUNTER — Emergency Department: Payer: BC Managed Care – PPO

## 2021-07-20 DIAGNOSIS — S6991XA Unspecified injury of right wrist, hand and finger(s), initial encounter: Secondary | ICD-10-CM | POA: Diagnosis not present

## 2021-07-20 DIAGNOSIS — Z23 Encounter for immunization: Secondary | ICD-10-CM | POA: Insufficient documentation

## 2021-07-20 DIAGNOSIS — S61216A Laceration without foreign body of right little finger without damage to nail, initial encounter: Secondary | ICD-10-CM | POA: Insufficient documentation

## 2021-07-20 DIAGNOSIS — W25XXXA Contact with sharp glass, initial encounter: Secondary | ICD-10-CM | POA: Diagnosis not present

## 2021-07-20 DIAGNOSIS — S61411A Laceration without foreign body of right hand, initial encounter: Secondary | ICD-10-CM | POA: Diagnosis not present

## 2021-07-20 DIAGNOSIS — R0789 Other chest pain: Secondary | ICD-10-CM | POA: Diagnosis not present

## 2021-07-20 MED ORDER — TETANUS-DIPHTH-ACELL PERTUSSIS 5-2.5-18.5 LF-MCG/0.5 IM SUSY
0.5000 mL | PREFILLED_SYRINGE | Freq: Once | INTRAMUSCULAR | Status: AC
  Administered 2021-07-20: 0.5 mL via INTRAMUSCULAR
  Filled 2021-07-20: qty 0.5

## 2021-07-20 NOTE — ED Triage Notes (Signed)
Pt to ED from home c/o right pinky finger laceration.  States cut finger on broken glass of his phone screen.  Pt keeping finger wrapped and pressure on finger.  Still bleeding. ?

## 2021-07-20 NOTE — ED Provider Notes (Signed)
? ?[facility] ?Provider Note ? ? ? Event Date/Time  ? First MD Initiated Contact with Patient 07/20/21 0253   ?  (approximate) ? ? ?History  ? ?Laceration ? ? ?HPI ? ?Colin Glass is a 36 y.o. male who presents to the ED for evaluation of Laceration ?  ?Patient presents to the ED for evaluation of an accidental laceration to the pad of his right fifth finger.  Patient reports that he was drinking this evening, his cell phone broke and the glass of the screen accidentally cut the pad of his finger.  Denies other injuries. ? ?Physical Exam  ? ?Triage Vital Signs: ?ED Triage Vitals [07/20/21 0249]  ?Enc Vitals Group  ?   BP   ?   Pulse   ?   Resp   ?   Temp   ?   Temp src   ?   SpO2   ?   Weight 180 lb (81.6 kg)  ?   Height '6\' 1"'$  (1.854 m)  ?   Head Circumference   ?   Peak Flow   ?   Pain Score 8  ?   Pain Loc   ?   Pain Edu?   ?   Excl. in [place]?   ? ? ?Most recent vital signs: ?Vitals:  ? 07/20/21 0444  ?BP: 106/73  ?Pulse: 85  ?Resp: 14  ?Temp: (!) 97.5 ?F (36.4 ?C)  ?SpO2: 98%  ? ? ?General: Awake, no distress.  Clinically intoxicated. ?CV:  Good peripheral perfusion.  ?Resp:  Normal effort.  ?Abd:  No distention.  ?MSK:  Couple tiny lacerations collected together to the volar aspect of the distal phalanx of the right fifth finger.  Hemostatic with direct pressure.  Each about 2 mm in size, semilunate , into the dermis ?Neuro:  No focal deficits appreciated. ?Other:   ? ? ?ED Results / Procedures / Treatments  ? ?Labs ?(all labs ordered are listed, but only abnormal results are displayed) ?Labs Reviewed - No data to display ? ?EKG ? ? ?RADIOLOGY ?Plain film of the right little finger reviewed by me without evidence of fracture or foreign body ? ?Official radiology report(s): ?DG Finger Little Right ? ?Result Date: 07/20/2021 ?CLINICAL DATA:  Laceration to the palm are distal phalanx. Evaluate foreign body. Cut with broken glass. EXAM: RIGHT LITTLE FINGER 2+V COMPARISON:  Right hand 07/16/2019  FINDINGS: Right fifth finger appears intact. No evidence of acute fracture or subluxation. No focal bone lesion or bone destruction. Bone cortex and trabecular architecture appear intact. No radiopaque soft tissue foreign bodies. IMPRESSION: No acute bony abnormalities. No radiopaque soft tissue foreign bodies identified. Electronically Signed   By: Lucienne Capers M.D.   On: 07/20/2021 04:02   ? ?PROCEDURES and INTERVENTIONS: ? ?Marland Kitchen.Laceration Repair ? ?Date/Time: 07/20/2021 4:52 AM ?Performed by: Vladimir Crofts, MD ?Authorized by: Vladimir Crofts, MD  ? ?Consent:  ?  Consent obtained:  Verbal ?  Consent given by:  Patient ?  Risks, benefits, and alternatives were discussed: yes   ?Laceration details:  ?  Location:  Finger ?  Finger location:  R small finger ?Exploration:  ?  Hemostasis achieved with:  Direct pressure ?  Imaging obtained: x-ray   ?  Imaging outcome: foreign body not noted   ?Treatment:  ?  Amount of cleaning:  Standard ?  Irrigation method:  Tap ?  Visualized foreign bodies/material removed: no   ?Skin repair:  ?  Repair method:  Tissue adhesive ?Approximation:  ?  Approximation:  Close ?Repair type:  ?  Repair type:  Simple ?Post-procedure details:  ?  Dressing:  Open (no dressing) ?  Procedure completion:  Tolerated well, no immediate complications ? ?Medications  ?Tdap (BOOSTRIX) injection 0.5 mL (0.5 mLs Intramuscular Given 07/20/21 0439)  ? ? ? ?IMPRESSION / MDM / ASSESSMENT AND PLAN / ED COURSE  ?I reviewed the triage vital signs and the nursing notes. ? ?36 year old male presents to the ED with small laceration to the little finger requiring Dermabond repair and discharge.  Updated Tdap.  No other evidence of acute trauma or pathology.  No evidence of dislocation, fracture or foreign body on x-ray. ? ?Clinical Course as of 07/20/21 0446  ?Thu Jul 20, 2021  ?2831 Lac repair performed by me [DS]  ?  ?Clinical Course User Index ?[DS] Vladimir Crofts, MD  ? ? ? ?FINAL CLINICAL IMPRESSION(S) / ED DIAGNOSES   ? ?Final diagnoses:  ?Laceration of right little finger without foreign body without damage to nail, initial encounter  ? ? ? ?Rx / DC Orders  ? ?ED Discharge Orders   ? ? None  ? ?  ? ? ? ?Note:  This document was prepared using Dragon voice recognition software and may include unintentional dictation errors. ?  ?Vladimir Crofts, MD ?07/20/21 [phone] ? ?

## 2021-09-07 DIAGNOSIS — H5713 Ocular pain, bilateral: Secondary | ICD-10-CM | POA: Diagnosis not present

## 2021-09-07 DIAGNOSIS — Z79899 Other long term (current) drug therapy: Secondary | ICD-10-CM | POA: Diagnosis not present

## 2021-09-07 DIAGNOSIS — H5789 Other specified disorders of eye and adnexa: Secondary | ICD-10-CM | POA: Diagnosis not present

## 2021-09-07 DIAGNOSIS — H1033 Unspecified acute conjunctivitis, bilateral: Secondary | ICD-10-CM | POA: Diagnosis not present

## 2021-09-07 DIAGNOSIS — R0981 Nasal congestion: Secondary | ICD-10-CM | POA: Diagnosis not present

## 2021-09-07 DIAGNOSIS — F17210 Nicotine dependence, cigarettes, uncomplicated: Secondary | ICD-10-CM | POA: Diagnosis not present

## 2021-09-07 DIAGNOSIS — J0100 Acute maxillary sinusitis, unspecified: Secondary | ICD-10-CM | POA: Diagnosis not present

## 2021-09-15 ENCOUNTER — Ambulatory Visit: Payer: Self-pay | Admitting: Nurse Practitioner

## 2021-09-15 NOTE — Progress Notes (Deleted)
   There were no vitals taken for this visit.   Subjective:    Patient ID: Colin Glass, male    DOB: 31-Mar-1986, 36 y.o.   MRN: [id number]  HPI: KADEEM Glass is a 36 y.o. male  No chief complaint on file.  Patient presents to clinic to establish care with new PCP.  Introduced to Designer, jewellery role and practice setting.  All questions answered.  Discussed provider/patient relationship and expectations.  Patient reports a history of ***. Patient denies a history of: Hypertension, Elevated Cholesterol, Diabetes, Thyroid problems, Depression, Anxiety, Neurological problems, and Abdominal problems.   Active Ambulatory Problems    Diagnosis Date Noted   No Active Ambulatory Problems   Resolved Ambulatory Problems    Diagnosis Date Noted   No Resolved Ambulatory Problems   Past Medical History:  Diagnosis Date   GERD (gastroesophageal reflux disease)    Past Surgical History:  Procedure Laterality Date   DENTAL SURGERY     MOUTH SURGERY     No family history on file.   Review of Systems  Per HPI unless specifically indicated above     Objective:    There were no vitals taken for this visit.  Wt Readings from Last 3 Encounters:  07/20/21 180 lb (81.6 kg)  07/13/21 180 lb (81.6 kg)  04/09/21 184 lb 15.5 oz (83.9 kg)    Physical Exam  Results for orders placed or performed during the hospital encounter of 88/41/66  Basic metabolic panel  Result Value Ref Range   Sodium 136 135 - 145 mmol/L   Potassium 3.9 3.5 - 5.1 mmol/L   Chloride 100 98 - 111 mmol/L   CO2 27 22 - 32 mmol/L   Glucose, Bld 100 (H) 70 - 99 mg/dL   BUN 12 6 - 20 mg/dL   Creatinine, Ser 1.17 0.61 - 1.24 mg/dL   Calcium 9.3 8.9 - 10.3 mg/dL   GFR, Estimated >60 >60 mL/min   Anion gap 9 5 - 15  CBC  Result Value Ref Range   WBC 6.0 4.0 - 10.5 K/uL   RBC 5.11 4.22 - 5.81 MIL/uL   Hemoglobin 15.0 13.0 - 17.0 g/dL   HCT 44.9 39.0 - 52.0 %   MCV 87.9 80.0 - 100.0 fL   MCH 29.4 26.0 - 34.0  pg   MCHC 33.4 30.0 - 36.0 g/dL   RDW 15.3 11.5 - 15.5 %   Platelets 252 150 - 400 K/uL   nRBC 0.0 0.0 - 0.2 %  Troponin I (High Sensitivity)  Result Value Ref Range   Troponin I (High Sensitivity) <2 <18 ng/L  Troponin I (High Sensitivity)  Result Value Ref Range   Troponin I (High Sensitivity) <2 <18 ng/L      Assessment & Plan:   Problem List Items Addressed This Visit   None Visit Diagnoses     Encounter to establish care    -  Primary        Follow up plan: No follow-ups on file.

## 2021-10-23 ENCOUNTER — Other Ambulatory Visit: Payer: Self-pay

## 2021-10-23 ENCOUNTER — Emergency Department: Payer: BC Managed Care – PPO

## 2021-10-23 DIAGNOSIS — S6991XA Unspecified injury of right wrist, hand and finger(s), initial encounter: Secondary | ICD-10-CM | POA: Diagnosis not present

## 2021-10-23 DIAGNOSIS — W2201XA Walked into wall, initial encounter: Secondary | ICD-10-CM | POA: Insufficient documentation

## 2021-10-23 DIAGNOSIS — M7989 Other specified soft tissue disorders: Secondary | ICD-10-CM | POA: Diagnosis not present

## 2021-10-23 DIAGNOSIS — S60221A Contusion of right hand, initial encounter: Secondary | ICD-10-CM | POA: Diagnosis not present

## 2021-10-23 NOTE — ED Triage Notes (Signed)
Pt presents to ER c/o right hand pain after punching wall yesterday.  Pt able to use all fingers, but states pain radiates up his arm when he moves certain fingers.  Some swelling noted to dorsal aspect of hand.  Pt A&O x4 at this time in NAD.

## 2021-10-24 ENCOUNTER — Emergency Department
Admission: EM | Admit: 2021-10-24 | Discharge: 2021-10-24 | Disposition: A | Discharge status: Home / Self Care | Payer: BC Managed Care – PPO | Attending: Emergency Medicine | Admitting: Emergency Medicine

## 2021-10-24 DIAGNOSIS — S60221A Contusion of right hand, initial encounter: Secondary | ICD-10-CM

## 2021-10-24 NOTE — ED Notes (Signed)
Pt discharge information reviewed. Pt understands need for follow up care and when to return if symptoms worsen. All questions answered. Pt is alert and oriented with even and regular respirations. Pt is seen ambulating out of department with string steady gait.   

## 2021-10-24 NOTE — ED Provider Notes (Signed)
[facility] Provider Note    Event Date/Time   First MD Initiated Contact with Patient 10/24/21 0202     (approximate)   History   Hand Injury   HPI  LENNELL SHANKS is a 36 y.o. male presents for evaluation of right hand pain.  Patient reports that he hit the wall yesterday and is complaining of pain over the second and third metacarpal bones.  No wrist pain, no elbow pain, no shoulder pain.     Past Medical History:  Diagnosis Date   GERD (gastroesophageal reflux disease)     Past Surgical History:  Procedure Laterality Date   DENTAL SURGERY     MOUTH SURGERY       Physical Exam   Triage Vital Signs: ED Triage Vitals  Enc Vitals Group     BP 10/23/21 2227 125/83     Pulse Rate 10/23/21 2227 67     Resp 10/23/21 2227 16     Temp 10/23/21 2227 97.9 F (36.6 C)     Temp Source 10/23/21 2227 Oral     SpO2 10/23/21 2227 98 %     Weight 10/23/21 2229 185 lb (83.9 kg)     Height 10/23/21 2229 '6\' 1"'$  (1.854 m)     Head Circumference --      Peak Flow --      Pain Score 10/23/21 2228 8     Pain Loc --      Pain Edu? --      Excl. in [place]? --     Most recent vital signs: Vitals:   10/23/21 2227  BP: 125/83  Pulse: 67  Resp: 16  Temp: 97.9 F (36.6 C)  SpO2: 98%     Constitutional: Alert and oriented. Well appearing and in no apparent distress. HEENT:      Head: Normocephalic and atraumatic.         Eyes: Conjunctivae are normal. Sclera is non-icteric.       Mouth/Throat: Mucous membranes are moist.       Neck: Supple with no signs of meningismus. Cardiovascular: Regular rate and rhythm.  Respiratory: Normal respiratory effort.  Musculoskeletal: Minimal swelling overlying the second and third metacarpal bones with no bony deformity or tenderness.  There is no tenderness to palpation of the snuffbox or with axial load of the thumb, full painless range of motion of the elbow, shoulder, and wrist Neurologic: Normal speech and  language. Face is symmetric. Moving all extremities. No gross focal neurologic deficits are appreciated. Skin: Skin is warm, dry and intact. No rash noted. Psychiatric: Mood and affect are normal. Speech and behavior are normal.  ED Results / Procedures / Treatments   Labs (all labs ordered are listed, but only abnormal results are displayed) Labs Reviewed - No data to display   EKG  none   RADIOLOGY I, Rudene Re, attending MD, have personally viewed and interpreted the images obtained during this visit as below:  X-rays negative for fracture or dislocation   ___________________________________________________ Interpretation by Radiologist:  DG Hand Complete Right  Result Date: 10/23/2021 CLINICAL DATA:  Right hand injury and swelling after punching a wall yesterday. EXAM: RIGHT HAND - COMPLETE 3+ VIEW COMPARISON:  Right fifth finger 07/20/2021 and right hand 07/16/2019 FINDINGS: Deformity of the right fifth metacarpal bone is unchanged since prior studies consistent with old fracture deformity. No acute fracture or dislocation is identified. Mild medial soft tissue swelling over the right hand. No focal bone lesion  or bone destruction. Joint spaces are normal. IMPRESSION: Old healed fracture deformity of the right fifth metacarpal bone. No acute bony abnormalities. Soft tissue swelling. Electronically Signed   By: Lucienne Capers M.D.   On: 10/23/2021 22:54       PROCEDURES:  Critical Care performed: No  Procedures    IMPRESSION / MDM / ASSESSMENT AND PLAN / ED COURSE  I reviewed the triage vital signs and the nursing notes.   36 y.o. male presents for evaluation of right hand pain after hitting a wall 24 hours ago.  Patient has minimal swelling with no bony tenderness or deformities.  X-rays negative.  Diagnosis of a hand contusion.  Recommended ice, ibuprofen and Tylenol.   MEDICATIONS GIVEN IN ED: Medications - No data to display   EMR reviewed  none    FINAL CLINICAL IMPRESSION(S) / ED DIAGNOSES   Final diagnoses:  Contusion of right hand, initial encounter     Rx / DC Orders   ED Discharge Orders     None        Note:  This document was prepared using Dragon voice recognition software and may include unintentional dictation errors.   Please note:  Patient was evaluated in Emergency Department today for the symptoms described in the history of present illness. Patient was evaluated in the context of the global COVID-19 pandemic, which necessitated consideration that the patient might be at risk for infection with the SARS-CoV-2 virus that causes COVID-19. Institutional protocols and algorithms that pertain to the evaluation of patients at risk for COVID-19 are in a state of rapid change based on information released by regulatory bodies including the CDC and federal and state organizations. These policies and algorithms were followed during the patient's care in the ED.  Some ED evaluations and interventions may be delayed as a result of limited staffing during the pandemic.       Alfred Levins, [place], MD 10/24/21 [place] [place]

## 2021-10-27 ENCOUNTER — Encounter: Payer: Self-pay | Admitting: Emergency Medicine

## 2021-10-27 ENCOUNTER — Emergency Department
Admission: EM | Admit: 2021-10-27 | Discharge: 2021-10-27 | Disposition: A | Discharge status: Home / Self Care | Payer: BC Managed Care – PPO | Attending: Emergency Medicine | Admitting: Emergency Medicine

## 2021-10-27 DIAGNOSIS — R21 Rash and other nonspecific skin eruption: Secondary | ICD-10-CM | POA: Diagnosis not present

## 2021-10-27 DIAGNOSIS — L299 Pruritus, unspecified: Secondary | ICD-10-CM | POA: Diagnosis not present

## 2021-10-27 DIAGNOSIS — S6991XA Unspecified injury of right wrist, hand and finger(s), initial encounter: Secondary | ICD-10-CM | POA: Diagnosis not present

## 2021-10-27 MED ORDER — DIPHENHYDRAMINE HCL 25 MG PO CAPS
25.0000 mg | ORAL_CAPSULE | ORAL | Status: AC
  Administered 2021-10-27: 25 mg via ORAL
  Filled 2021-10-27: qty 1

## 2021-10-27 MED ORDER — HYDROXYZINE HCL 10 MG PO TABS: 10.0000 mg | ORAL_TABLET | Freq: Three times a day (TID) | ORAL | 0 refills | Status: DC | PRN

## 2021-10-27 MED ORDER — PREDNISONE 20 MG PO TABS: 60.0000 mg | ORAL_TABLET | Freq: Every day | ORAL | 0 refills | Status: AC

## 2021-10-27 MED ORDER — PREDNISONE 20 MG PO TABS
60.0000 mg | ORAL_TABLET | ORAL | Status: AC
  Administered 2021-10-27: 60 mg via ORAL
  Filled 2021-10-27: qty 3

## 2021-11-01 ENCOUNTER — Ambulatory Visit (INDEPENDENT_AMBULATORY_CARE_PROVIDER_SITE_OTHER): Payer: BC Managed Care – PPO | Admitting: Family Medicine

## 2021-11-01 ENCOUNTER — Encounter: Payer: Self-pay | Admitting: Family Medicine

## 2021-11-01 ENCOUNTER — Other Ambulatory Visit: Payer: Self-pay | Admitting: Family Medicine

## 2021-11-01 VITALS — BP 116/62 | HR 87 | Ht 74.0 in | Wt 178.0 lb

## 2021-11-01 DIAGNOSIS — R7309 Other abnormal glucose: Secondary | ICD-10-CM

## 2021-11-01 DIAGNOSIS — Z0000 Encounter for general adult medical examination without abnormal findings: Secondary | ICD-10-CM

## 2021-11-01 DIAGNOSIS — J3089 Other allergic rhinitis: Secondary | ICD-10-CM | POA: Diagnosis not present

## 2021-11-01 DIAGNOSIS — Z7689 Persons encountering health services in other specified circumstances: Secondary | ICD-10-CM | POA: Diagnosis not present

## 2021-11-01 DIAGNOSIS — Z114 Encounter for screening for human immunodeficiency virus [HIV]: Secondary | ICD-10-CM

## 2021-11-01 DIAGNOSIS — Z13220 Encounter for screening for lipoid disorders: Secondary | ICD-10-CM

## 2021-11-01 DIAGNOSIS — K219 Gastro-esophageal reflux disease without esophagitis: Secondary | ICD-10-CM | POA: Diagnosis not present

## 2021-11-01 DIAGNOSIS — Z1159 Encounter for screening for other viral diseases: Secondary | ICD-10-CM

## 2021-11-01 MED ORDER — OMEPRAZOLE 40 MG PO CPDR: 40.0000 mg | DELAYED_RELEASE_CAPSULE | Freq: Every day | ORAL | 2 refills | Status: DC

## 2021-11-01 MED ORDER — SUCRALFATE 1 G PO TABS: 1.0000 g | ORAL_TABLET | Freq: Three times a day (TID) | ORAL | 2 refills | Status: DC

## 2021-11-01 NOTE — Progress Notes (Signed)
Subjective:    Patient ID: Colin Glass, male    DOB: 11-02-1985, 36 y.o.   MRN: [id number]  Colin Glass is a 36 y.o. male presenting on 11/01/2021 for Gastroesophageal Reflux   HPI  Seasonal Environmental Allergies Admits seasonal symptoms Taking OTC allergy med, intermittently  GERD Frequent flare heartburn Taking Famotidine OTC '20mg'$  x 2 ='40mg'$  in AM, and in PM '40mg'$  Most meals can cause heartburn Worse with spicy foods, meats, sauce. Can be some worsen 7-8pm Does have worsening with sodas and alcohol.  Health Maintenance: Review at physical     11/01/2021    4:05 PM  Depression screen PHQ 2/9  Decreased Interest 2  Down, Depressed, Hopeless 0  PHQ - 2 Score 2  Altered sleeping 1  Tired, decreased energy 1  Change in appetite 0  Feeling bad or failure about yourself  1  Trouble concentrating 0  Moving slowly or fidgety/restless 0  Suicidal thoughts 0  PHQ-9 Score 5  Difficult doing work/chores Somewhat difficult    Past Medical History:  Diagnosis Date   Allergy    GERD (gastroesophageal reflux disease)    Past Surgical History:  Procedure Laterality Date   DENTAL SURGERY     MOUTH SURGERY     Social History   Socioeconomic History   Marital status: Single    Spouse name: Not on file   Number of children: Not on file   Years of education: Not on file   Highest education level: Not on file  Occupational History   Not on file  Tobacco Use   Smoking status: Former    Packs/day: 0.50    Years: 10.00    Total pack years: 5.00    Types: Cigarettes    Quit date: 10/21/2014    Years since quitting: 7.0   Smokeless tobacco: Never   Tobacco comments:    pt reports currently smoking 1 black and mild a day  Vaping Use   Vaping Use: Never used  Substance and Sexual Activity   Alcohol use: Yes    Alcohol/week: 10.0 standard drinks of alcohol    Types: 10 Standard drinks or equivalent per week    Comment: socially   Drug use: Yes    Types:  Marijuana   Sexual activity: Not on file  Other Topics Concern   Not on file  Social History Narrative   Not on file   Social Determinants of Health   Financial Resource Strain: Not on file  Food Insecurity: Not on file  Transportation Needs: Not on file  Physical Activity: Not on file  Stress: Not on file  Social Connections: Not on file  Intimate Partner Violence: Not on file   History reviewed. No pertinent family history. Current Outpatient Medications on File Prior to Visit  Medication Sig   famotidine (PEPCID) 20 MG tablet Take 20 mg by mouth 2 (two) times daily.   fluticasone (FLONASE) 50 MCG/ACT nasal spray Place 2 sprays into both nostrils daily.   hydrOXYzine (ATARAX) 10 MG tablet Take 1 tablet (10 mg total) by mouth 3 (three) times daily as needed for itching.   predniSONE (DELTASONE) 20 MG tablet Take 3 tablets (60 mg total) by mouth daily with breakfast for 5 days.   albuterol (PROVENTIL HFA;VENTOLIN HFA) 108 (90 Base) MCG/ACT inhaler Inhale 2 puffs into the lungs every 6 (six) hours as needed for wheezing or shortness of breath. (Patient not taking: Reported on 11/01/2021)   cetirizine (ZYRTEC) 10 MG  tablet Take 10 mg by mouth daily. (Patient not taking: Reported on 11/01/2021)   No current facility-administered medications on file prior to visit.    Review of Systems Per HPI unless specifically indicated above     Objective:    BP 116/62   Pulse 87   Ht '6\' 2"'$  (1.88 m)   Wt 178 lb (80.7 kg)   SpO2 99%   BMI 22.85 kg/m   Wt Readings from Last 3 Encounters:  11/01/21 178 lb (80.7 kg)  10/27/21 185 lb (83.9 kg)  10/23/21 185 lb (83.9 kg)    Physical Exam Vitals and nursing note reviewed.  Constitutional:      General: He is not in acute distress.    Appearance: Normal appearance. He is well-developed. He is not diaphoretic.     Comments: Well-appearing, comfortable, cooperative  HENT:     Head: Normocephalic and atraumatic.  Eyes:     General:         Right eye: No discharge.        Left eye: No discharge.     Conjunctiva/sclera: Conjunctivae normal.  Cardiovascular:     Rate and Rhythm: Normal rate.  Pulmonary:     Effort: Pulmonary effort is normal.  Skin:    General: Skin is warm and dry.     Findings: No erythema or rash.  Neurological:     Mental Status: He is alert and oriented to person, place, and time.  Psychiatric:        Mood and Affect: Mood normal.        Behavior: Behavior normal.        Thought Content: Thought content normal.     Comments: Well groomed, good eye contact, normal speech and thoughts      Results for orders placed or performed during the hospital encounter of 05/08/70  Basic metabolic panel  Result Value Ref Range   Sodium 136 135 - 145 mmol/L   Potassium 3.9 3.5 - 5.1 mmol/L   Chloride 100 98 - 111 mmol/L   CO2 27 22 - 32 mmol/L   Glucose, Bld 100 (H) 70 - 99 mg/dL   BUN 12 6 - 20 mg/dL   Creatinine, Ser 1.17 0.61 - 1.24 mg/dL   Calcium 9.3 8.9 - 10.3 mg/dL   GFR, Estimated >60 >60 mL/min   Anion gap 9 5 - 15  CBC  Result Value Ref Range   WBC 6.0 4.0 - 10.5 K/uL   RBC 5.11 4.22 - 5.81 MIL/uL   Hemoglobin 15.0 13.0 - 17.0 g/dL   HCT 44.9 39.0 - 52.0 %   MCV 87.9 80.0 - 100.0 fL   MCH 29.4 26.0 - 34.0 pg   MCHC 33.4 30.0 - 36.0 g/dL   RDW 15.3 11.5 - 15.5 %   Platelets 252 150 - 400 K/uL   nRBC 0.0 0.0 - 0.2 %  Troponin I (High Sensitivity)  Result Value Ref Range   Troponin I (High Sensitivity) <2 <18 ng/L  Troponin I (High Sensitivity)  Result Value Ref Range   Troponin I (High Sensitivity) <2 <18 ng/L      Assessment & Plan:   Problem List Items Addressed This Visit   None Visit Diagnoses     Gastroesophageal reflux disease, unspecified whether esophagitis present    -  Primary   Relevant Medications   famotidine (PEPCID) 20 MG tablet   omeprazole (PRILOSEC) 40 MG capsule   sucralfate (CARAFATE) 1 g tablet   Environmental  and seasonal allergies       Encounter to  establish care with new doctor           Establish care Reviewed chart.  Seasonal Environmental Allergies Continue with regular use 2nd gen anti histamine OTC generic  GERD Uncontrolled, persistent symptoms Trial on PPI therapy - History not suggestive of PUD (no regular NSAID use, pain not improved with eating, not long term uncontrolled GERD)  Plan: 1. Start rx Omeprazole '40mg'$  daily 30 min prior to 1st meal for 4+ weeks, may need repeat course in future if recurrence 2. Diet modifications reduce GERD, reduce alcohol, caffeine 3. Also given rx Carafate PRN 4. Follow-up 4-6 weeks w/ physical   Meds ordered this encounter  Medications   omeprazole (PRILOSEC) 40 MG capsule    Sig: Take 1 capsule (40 mg total) by mouth daily before breakfast.    Dispense:  30 capsule    Refill:  2   sucralfate (CARAFATE) 1 g tablet    Sig: Take 1 tablet (1 g total) by mouth 4 (four) times daily -  with meals and at bedtime. As needed only for heartburn flare.    Dispense:  30 tablet    Refill:  2     Follow up plan: Return in about 4 weeks (around 11/29/2021) for 4 weeks fasting lab only then 1 week later Annual Physical.  Future CMET, A1c, CBC, Lipid, HIV, Hep C  Nobie Putnam, DO [facility] 11/01/2021, 3:22 PM

## 2021-11-01 NOTE — Patient Instructions (Addendum)
Thank you for coming to the office today.  Recommend Loratadine (Claritin) '10mg'$  or Fexofenadine (Allegra) '180mg'$  daily before allergy season. OTC is good.   Starting tomorrow before breakfast Omeprazole '40mg'$  daily before 1st meal. Prefer to take this med about 30 min before breakfast or 1st meal of day for next several weeks to months until we meet again. May be able to phase off and reduce it if needed and problem is better, we can taper down on it in future.   Take other prescribed medicine Carafate (Sucralfate) as needed up to 4 times daily (3 meals and bedtime)  to coat stomach lining to ease symptoms, if it helps reduce symptoms then it is more likely to be due to acid and/or ulcer.  DIET RECOMMENDATIONS - Avoid spicy, greasy, fried foods, also things like caffeine, dark chocolate, peppermint can worsen - Avoid large meals and late night snacks, also do not go more than 4-5 hours without a snack or meal (not eating will worsen reflux symptoms due to stomach acid) - You may also elevate the head of your bed at night to sleep at very slight incline to help reduce symptoms   If the problem improves but keeps coming back, we can discuss higher dose or longer course at next visit.   If symptoms are worsening or persistent despite treatment or develop any different severe esophagus or abdominal pain, unable to swallow solids or liquids, nausea, vomiting especially blood in vomit, fever/chills, or unintentional weight loss / no appetite, please follow-up sooner in office or seek more immediate medical attention at hospital Emergency Department.  Regarding other medicines:   - STOP taking Ibuprofen, Advil, Motrin, Goody's / BC powder - DO NOT take without discussing with your doctor. These medicines can put you at high risk for future bleeding.  If need pain medicine, may take Tylenol Extra Strength (Acetaminophen) '500mg'$  tabs - take 1 to 2 tabs per dose (max '1000mg'$ ) every 6-8 hours for pain (take  regularly, don't skip a dose for next 7 days), max 24 hour daily dose is 6 tablets or '3000mg'$ . In the future you can repeat the same everyday Tylenol course for 1-2 weeks at a time.   DUE for FASTING BLOOD WORK (no food or drink after midnight before the lab appointment, only water or coffee without cream/sugar on the morning of)  SCHEDULE "Lab Only" visit in the morning at the clinic for lab draw in 1 MONTHS   - Make sure Lab Only appointment is at about 1 week before your next appointment, so that results will be available  For Lab Results, once available within 2-3 days of blood draw, you can can log in to MyChart online to view your results and a brief explanation. Also, we can discuss results at next follow-up visit.    Please schedule a Follow-up Appointment to: Return in about 4 weeks (around 11/29/2021) for 4 weeks fasting lab only then 1 week later Annual Physical.  If you have any other questions or concerns, please feel free to call the office or send a message through [place]. You may also schedule an earlier appointment if necessary.  Additionally, you may be receiving a survey about your experience at our office within a few days to 1 week by e-mail or mail. We value your feedback.  Nobie Putnam, DO [place]

## 2021-11-03 ENCOUNTER — Ambulatory Visit: Payer: Medicaid Other | Admitting: Family Medicine

## 2021-11-06 ENCOUNTER — Ambulatory Visit: Payer: Medicaid Other | Admitting: Family Medicine

## 2021-12-11 ENCOUNTER — Other Ambulatory Visit: Payer: Medicaid Other

## 2021-12-11 DIAGNOSIS — Z1159 Encounter for screening for other viral diseases: Secondary | ICD-10-CM

## 2021-12-11 DIAGNOSIS — Z0000 Encounter for general adult medical examination without abnormal findings: Secondary | ICD-10-CM

## 2021-12-11 DIAGNOSIS — Z114 Encounter for screening for human immunodeficiency virus [HIV]: Secondary | ICD-10-CM

## 2021-12-11 DIAGNOSIS — R7309 Other abnormal glucose: Secondary | ICD-10-CM

## 2021-12-11 DIAGNOSIS — Z13220 Encounter for screening for lipoid disorders: Secondary | ICD-10-CM

## 2021-12-18 ENCOUNTER — Encounter: Payer: Medicaid Other | Admitting: Family Medicine

## 2022-05-28 ENCOUNTER — Emergency Department
Admission: EM | Admit: 2022-05-28 | Discharge: 2022-05-28 | Disposition: A | Discharge status: Home / Self Care | Payer: BC Managed Care – PPO | Attending: Emergency Medicine | Admitting: Emergency Medicine

## 2022-05-28 ENCOUNTER — Other Ambulatory Visit: Payer: Self-pay

## 2022-05-28 ENCOUNTER — Encounter: Payer: Self-pay | Admitting: Emergency Medicine

## 2022-05-28 DIAGNOSIS — U071 COVID-19: Secondary | ICD-10-CM | POA: Insufficient documentation

## 2022-05-28 LAB — RESP PANEL BY RT-PCR (RSV, FLU A&B, COVID)  RVPGX2
Influenza A by PCR: NEGATIVE
Influenza B by PCR: NEGATIVE
Resp Syncytial Virus by PCR: NEGATIVE
SARS Coronavirus 2 by RT PCR: POSITIVE — AB

## 2022-05-28 NOTE — Discharge Instructions (Signed)
Alternate Tylenol and ibuprofen for body aches. Rest and stay hydrated at home.

## 2022-05-28 NOTE — ED Provider Triage Note (Signed)
Emergency Medicine Provider Triage Evaluation Note  Colin Glass , a 37 y.o. male  was evaluated in triage.  Pt complains of flu like symptoms beginning this AM. No shob, CP, GI symptoms.  Review of Systems  Positive: Body aches, congestion, cough Negative: GI symptoms, shob  Physical Exam  BP (!) 150/99 (BP Location: Left Arm)   Pulse (!) 103   Temp 99.1 F (37.3 C) (Oral)   Resp 18   SpO2 98%  Gen:   Awake, no distress   Resp:  Normal effort  MSK:   Moves extremities without difficulty  Other:    Medical Decision Making  Medically screening exam initiated at 3:44 PM.  Appropriate orders placed.  Colin Glass was informed that the remainder of the evaluation will be completed by another provider, this initial triage assessment does not replace that evaluation, and the importance of remaining in the ED until their evaluation is complete.  Viral swab   Darletta Moll, PA-C 05/28/22 1545

## 2022-05-28 NOTE — ED Triage Notes (Signed)
Patient to ED via POV for generalized body aches and headaches. States symptoms started this AM.

## 2022-06-04 NOTE — ED Provider Notes (Signed)
[facility] Provider Note  Patient Contact: 11:05 PM (approximate)   History   Generalized Body Aches   HPI  Colin Glass is a 37 y.o. male presents to the emergency department with bodyaches and headache for 1 day.  No chest pain, chest tightness or abdominal pain.      Physical Exam   Triage Vital Signs: ED Triage Vitals [05/28/22 1541]  Enc Vitals Group     BP (!) 150/99     Pulse Rate (!) 103     Resp 18     Temp 99.1 F (37.3 C)     Temp Source Oral     SpO2 98 %     Weight      Height      Head Circumference      Peak Flow      Pain Score 10     Pain Loc      Pain Edu?      Excl. in [place]?     Most recent vital signs: Vitals:   05/28/22 1541  BP: (!) 150/99  Pulse: (!) 103  Resp: 18  Temp: 99.1 F (37.3 C)  SpO2: 98%     Constitutional: Alert and oriented. Patient is lying supine. Eyes: Conjunctivae are normal. PERRL. EOMI. Head: Atraumatic. ENT:      Ears: Tympanic membranes are mildly injected with mild effusion bilaterally.       Nose: No congestion/rhinnorhea.      Mouth/Throat: Mucous membranes are moist. Posterior pharynx is mildly erythematous.  Hematological/Lymphatic/Immunilogical: No cervical lymphadenopathy.  Cardiovascular: Normal rate, regular rhythm. Normal S1 and S2.  Good peripheral circulation. Respiratory: Normal respiratory effort without tachypnea or retractions. Lungs CTAB. Good air entry to the bases with no decreased or absent breath sounds. Gastrointestinal: Bowel sounds 4 quadrants. Soft and nontender to palpation. No guarding or rigidity. No palpable masses. No distention. No CVA tenderness. Musculoskeletal: Full range of motion to all extremities. No gross deformities appreciated. Neurologic:  Normal speech and language. No gross focal neurologic deficits are appreciated.  Skin:  Skin is warm, dry and intact. No rash noted. Psychiatric: Mood and affect are normal. Speech and behavior are  normal. Patient exhibits appropriate insight and judgement.   ED Results / Procedures / Treatments   Labs (all labs ordered are listed, but only abnormal results are displayed) Labs Reviewed  RESP PANEL BY RT-PCR (RSV, FLU A&B, COVID)  [id number] - Abnormal; Notable for the following components:      Result Value   SARS Coronavirus 2 by RT PCR POSITIVE (*)    All other components within normal limits        PROCEDURES:  Critical Care performed: No  Procedures   MEDICATIONS ORDERED IN ED: Medications - No data to display   IMPRESSION / MDM / [place] / ED COURSE  I reviewed the triage vital signs and the nursing notes.                              Assessment and plan COVID-36 37 year old male presents to the emergency department with flulike symptoms for 1 day.  Patient was hypertensive at triage but vital signs otherwise reassuring.   Supportive medications were encouraged for home use.  Return precautions were given to return with new or worsening symptoms.   FINAL CLINICAL IMPRESSION(S) / ED DIAGNOSES   Final diagnoses:  [id number]     Rx /  DC Orders   ED Discharge Orders     None        Note:  This document was prepared using Dragon voice recognition software and may include unintentional dictation errors.   Vallarie Mare [place], PA-C 06/04/22 2306    Har[place], MD 06/11/22 1354

## 2022-08-21 ENCOUNTER — Emergency Department: Payer: PRIVATE HEALTH INSURANCE

## 2022-08-21 ENCOUNTER — Other Ambulatory Visit: Payer: Self-pay

## 2022-08-21 ENCOUNTER — Emergency Department
Admission: EM | Admit: 2022-08-21 | Discharge: 2022-08-21 | Disposition: A | Discharge status: Home / Self Care | Payer: PRIVATE HEALTH INSURANCE | Attending: Emergency Medicine | Admitting: Emergency Medicine

## 2022-08-21 DIAGNOSIS — D72829 Elevated white blood cell count, unspecified: Secondary | ICD-10-CM | POA: Insufficient documentation

## 2022-08-21 DIAGNOSIS — K2900 Acute gastritis without bleeding: Secondary | ICD-10-CM | POA: Insufficient documentation

## 2022-08-21 DIAGNOSIS — R112 Nausea with vomiting, unspecified: Secondary | ICD-10-CM

## 2022-08-21 DIAGNOSIS — R109 Unspecified abdominal pain: Secondary | ICD-10-CM | POA: Diagnosis present

## 2022-08-21 DIAGNOSIS — K226 Gastro-esophageal laceration-hemorrhage syndrome: Secondary | ICD-10-CM | POA: Insufficient documentation

## 2022-08-21 LAB — HEPATIC FUNCTION PANEL
ALT: 56 U/L — ABNORMAL HIGH (ref 0–44)
AST: 53 U/L — ABNORMAL HIGH (ref 15–41)
Albumin: 4.6 g/dL (ref 3.5–5.0)
Alkaline Phosphatase: 82 U/L (ref 38–126)
Bilirubin, Direct: 0.3 mg/dL — ABNORMAL HIGH (ref 0.0–0.2)
Indirect Bilirubin: 2.1 mg/dL — ABNORMAL HIGH (ref 0.3–0.9)
Total Bilirubin: 2.4 mg/dL — ABNORMAL HIGH (ref 0.3–1.2)
Total Protein: 7.3 g/dL (ref 6.5–8.1)

## 2022-08-21 LAB — CBC
HCT: 46.9 % (ref 39.0–52.0)
Hemoglobin: 15.6 g/dL (ref 13.0–17.0)
MCH: 29.5 pg (ref 26.0–34.0)
MCHC: 33.3 g/dL (ref 30.0–36.0)
MCV: 88.8 fL (ref 80.0–100.0)
Platelets: 212 10*3/uL (ref 150–400)
RBC: 5.28 MIL/uL (ref 4.22–5.81)
RDW: 14.8 % (ref 11.5–15.5)
WBC: 11.9 10*3/uL — ABNORMAL HIGH (ref 4.0–10.5)
nRBC: 0 % (ref 0.0–0.2)

## 2022-08-21 LAB — BASIC METABOLIC PANEL
Anion gap: 12 (ref 5–15)
BUN: 12 mg/dL (ref 6–20)
CO2: 24 mmol/L (ref 22–32)
Calcium: 9 mg/dL (ref 8.9–10.3)
Chloride: 101 mmol/L (ref 98–111)
Creatinine, Ser: 0.96 mg/dL (ref 0.61–1.24)
GFR, Estimated: >60 mL/min (ref >60)
Glucose, Bld: 88 mg/dL (ref 70–99)
Potassium: 4 mmol/L (ref 3.5–5.1)
Sodium: 137 mmol/L (ref 135–145)

## 2022-08-21 LAB — TROPONIN I (HIGH SENSITIVITY)
Troponin I (High Sensitivity): <2 ng/L (ref <18)
Troponin I (High Sensitivity): <2 ng/L (ref <18)

## 2022-08-21 LAB — LIPASE, BLOOD: Lipase: 48 U/L (ref 11–51)

## 2022-08-21 LAB — PROTIME-INR
INR: 1 (ref 0.8–1.2)
Prothrombin Time: 13.4 seconds (ref 11.4–15.2)

## 2022-08-21 MED ORDER — PANTOPRAZOLE SODIUM 40 MG PO TBEC: 40.0000 mg | DELAYED_RELEASE_TABLET | Freq: Every day | ORAL | 0 refills | Status: DC

## 2022-08-21 MED ORDER — SUCRALFATE 1 G PO TABS: 1.0000 g | ORAL_TABLET | Freq: Three times a day (TID) | ORAL | 0 refills | Status: DC

## 2022-08-21 MED ORDER — ONDANSETRON HCL 4 MG/2ML IJ SOLN
4.0000 mg | Freq: Once | INTRAMUSCULAR | Status: AC
  Administered 2022-08-21: 4 mg via INTRAVENOUS
  Filled 2022-08-21: qty 2

## 2022-08-21 MED ORDER — ALUM & MAG HYDROXIDE-SIMETH 200-200-20 MG/5ML PO SUSP
30.0000 mL | Freq: Once | ORAL | Status: AC
  Administered 2022-08-21: 30 mL via ORAL
  Filled 2022-08-21: qty 30

## 2022-08-21 MED ORDER — SODIUM CHLORIDE 0.9 % IV BOLUS
1000.0000 mL | Freq: Once | INTRAVENOUS | Status: AC
  Administered 2022-08-21: 1000 mL via INTRAVENOUS

## 2022-08-21 MED ORDER — PANTOPRAZOLE SODIUM 40 MG IV SOLR
40.0000 mg | Freq: Once | INTRAVENOUS | Status: AC
  Administered 2022-08-21: 40 mg via INTRAVENOUS
  Filled 2022-08-21: qty 10

## 2022-08-21 NOTE — ED Notes (Signed)
Verbal order from EDP [place]ke to hold repeat type and screen but confirmed to collect repeat trop.

## 2022-08-21 NOTE — ED Triage Notes (Signed)
Pt to ED for epigastric pain that started yesterday. States emesis last night with some blood in it. Denies fevers.  Ambulatory to triage, NAD noted

## 2022-08-21 NOTE — ED Provider Notes (Signed)
[facility] Provider Note    Event Date/Time   First MD Initiated Contact with Patient 08/21/22 0827     (approximate)   History   Abdominal Pain and Chest Pain   HPI  Colin Glass is a 37 y.o. male  who comes in with abdominal pain.  Patient reports that he is otherwise healthy occasionally takes acid medication occasionaly over-the-counter as needed who comes in with abdominal pain.  Patient reports that yesterday he had vomiting 2 episodes initially non bloody and then developed an episode of bright red bloody emesis x1 last night..  He did not take a picture of it states that it was 1 time.  States that he has had runny dark stool.  Patient does report that he is drinks every other day or so but has gone a week without drinking without withdraw. When he does drink he drinks about an 1/2 of a fifth of liquor. Denies h/o Gi bleed. He denies any history of known ulcers or varices.  He states that he has never had blood in his vomit in the past.  He reports having a burning sensation from his upper abdomen going up into his chest.  He denies any lower abdominal pain.   Physical Exam   Triage Vital Signs: ED Triage Vitals  Enc Vitals Group     BP 08/21/22 0824 120/84     Pulse Rate 08/21/22 0824 95     Resp 08/21/22 0824 18     Temp 08/21/22 0824 98.6 F (37 C)     Temp src --      SpO2 08/21/22 0824 100 %     Weight 08/21/22 0825 185 lb (83.9 kg)     Height 08/21/22 0825 6\' 2"  (1.88 m)     Head Circumference --      Peak Flow --      Pain Score 08/21/22 0825 10     Pain Loc --      Pain Edu? --      Excl. in GC? --     Most recent vital signs: Vitals:   08/21/22 0824  BP: 120/84  Pulse: 95  Resp: 18  Temp: 98.6 F (37 C)  SpO2: 100%     General: Awake, no distress.  CV:  Good peripheral perfusion.  Resp:  Normal effort.  Abd:  Mild epigastric tenderness without any rebound or guarding.  No crepitus felt along the chest wall or in the  neck. Other:     ED Results / Procedures / Treatments   Labs (all labs ordered are listed, but only abnormal results are displayed) Labs Reviewed  BASIC METABOLIC PANEL  CBC  LIPASE, BLOOD  HEPATIC FUNCTION PANEL  TROPONIN I (HIGH SENSITIVITY)     EKG  My interpretation of EKG:  Normal sinus rate of 92 without any ST elevations, T wave inversion in lead III, normal intervals  RADIOLOGY I have reviewed the xray personally and and interpreted and there is no evidence of any pneumonia, free air  PROCEDURES:  Critical Care performed: No  Procedures   MEDICATIONS ORDERED IN ED: Medications  pantoprazole (PROTONIX) injection 40 mg (has no administration in time range)  ondansetron (ZOFRAN) injection 4 mg (has no administration in time range)  alum & mag hydroxide-simeth (MAALOX/MYLANTA) 200-200-20 MG/5ML suspension 30 mL (has no administration in time range)     IMPRESSION / MDM / ASSESSMENT AND PLAN / ED COURSE  I reviewed the triage vital  signs and the nursing notes.   Patient's presentation is most consistent with acute presentation with potential threat to life or bodily function.   Patient comes in with episode of vomiting.  This initially sounds more like gastroenteritis with development of some Mallory-Weiss tears but I am concerned about the possibility of upper GI bleeding with such as variceal or ulcers given patient's EtOH abuse.  Will give a PPI, fluids, Zofran.  Patient is initially declining rectal exam but I explained the importance and how it can dictate change in care.  11:20 AM  Patient's labs are reassuring normal BUN.  Troponins are negative x 2.  INR normal CBC slightly elevated white count I did recheck a temperature and it was afebrile.  His hemoglobin is stable at 15.6.  Reevaluated patient he reports feeling better repeat abdominal exam is soft and nontender.  Discussed with patient that most likely this was a Mallory-Weiss tear, gastritis and  he is had no additional vomiting since last night.  However I did discuss with him that the safest case given ETOH use would be to admit him to the hospital for monitoring of bleeding and hemoglobin checks however patient declined stating he would prefer to go home and will return if he develops black stools or vomiting blood.  He understands the importance of alcohol cessation in a safe manner, started on acid reducers and following up with GI but at this time he declines admissions.  He understands there is a risk associated with this but states his partner can be with him the next 24 hours to monitor him-but given his reassuring workup stable vital signs and improving examination without any additional vomiting for some time now I think it is reasonable for him to go home.  He did let me do a rectal exam and it was without any evidence of melena which is re-assuring.   11:32 AM  His LFTs show elevated total bilirubin of 2.4 but I reviewed and he has had some elevation over the past few years as high as 2.6.  His AST and ALT are both slightly elevated I suspect from alcohol use.  Patient denies any IV use to suggest hepatitis. Denies excessive tylenol use.  Will have him follow this up with GI for further workup and testing-I did explain to explain that having liver issues can have an increased risk for varices and alcoholic hepatitis, development of cirrhosis although ultrasound without any obvious evidence of this.  Patient understands and expresses understanding of the importance of this stating that he had a family member who died from cirrhosis.  He understands that is very important to cut down on alcohol use.  We did discuss admission but patient continued decline.  He states that he is got a 91-year-old at home that he needs to go home to but he will return if symptoms are worsening but at this time declines admission.  Patient has a capacity make this decision.  His partner is at bedside and witnesses  this conversation he understands the risk of leaving including worsening bleeding that could lead to death.  I discussed the provisional nature of ED diagnosis, the treatment so far, the ongoing plan of care, follow up appointments and return precautions with the patient and any family or support people present. They expressed understanding and agreed with the plan, discharged home.         FINAL CLINICAL IMPRESSION(S) / ED DIAGNOSES   Final diagnoses:  Nausea and vomiting, unspecified vomiting  type  Acute gastritis, presence of bleeding unspecified, unspecified gastritis type  Mallory-Weiss tear     Rx / DC Orders   ED Discharge Orders     None        Note:  This document was prepared using Dragon voice recognition software and may include unintentional dictation errors.   Concha Se, MD 08/21/22 [phone]

## 2022-08-21 NOTE — ED Notes (Signed)
Imaging staff at bedside. Once US done will collect repeat pink tube for type and screen as requested by lab.

## 2022-08-21 NOTE — ED Notes (Signed)
To bedside with meds but imaging staff taking pt from room now. Will give meds once pt back to room.

## 2022-08-21 NOTE — ED Notes (Signed)
This RN at bedside; EDP Funke completing rectal exam.

## 2022-08-21 NOTE — ED Notes (Signed)
EDP Funke to bedside.  ?

## 2022-08-21 NOTE — ED Notes (Signed)
Registration at bedside.

## 2022-08-21 NOTE — Discharge Instructions (Addendum)
We discussed admission to the hospital to monitor for any bleeding by trending your hemoglobins given your elevated liver test, alcohol use but you have opted to want to leave which does have risk associated with it including worsening bleeding, death; however you have decided that you return if you develop black stools, vomiting up blood again, worsening pain or any other concerns.  You should call the GI number to make a follow-up and start taking the acid reducer and Carafate.  You should cut down on alcohol use and if you are not developing any withdrawals you can stop using it however this can be dangerous if you develop withdrawal symptoms so can return for medication to help with withdraw if needed.   Avoid ibuprofen, aspirin. Limit tylenol use to 2g in a day.

## 2022-08-21 NOTE — ED Notes (Signed)
See triage note. Pt repotrs only vomited once and it was "dark red". Pt c/o burning and pressure in lower medial abdomen; tenderness there per pt; denies changes to urination or BM's. Pt calmly laying on stretcher, skin dry and resp reg/unlabored. Visitor at bedside.

## 2023-02-19 ENCOUNTER — Emergency Department: Payer: PRIVATE HEALTH INSURANCE

## 2023-02-19 ENCOUNTER — Other Ambulatory Visit: Payer: Self-pay

## 2023-02-19 ENCOUNTER — Emergency Department
Admission: EM | Admit: 2023-02-19 | Discharge: 2023-02-19 | Disposition: A | Discharge status: Home / Self Care | Payer: PRIVATE HEALTH INSURANCE | Attending: Emergency Medicine | Admitting: Emergency Medicine

## 2023-02-19 DIAGNOSIS — K2900 Acute gastritis without bleeding: Secondary | ICD-10-CM | POA: Diagnosis not present

## 2023-02-19 DIAGNOSIS — R748 Abnormal levels of other serum enzymes: Secondary | ICD-10-CM | POA: Diagnosis not present

## 2023-02-19 DIAGNOSIS — R1013 Epigastric pain: Secondary | ICD-10-CM | POA: Diagnosis present

## 2023-02-19 LAB — BASIC METABOLIC PANEL
Anion gap: 11 (ref 5–15)
BUN: 9 mg/dL (ref 6–20)
CO2: 28 mmol/L (ref 22–32)
Calcium: 9.1 mg/dL (ref 8.9–10.3)
Chloride: 96 mmol/L — ABNORMAL LOW (ref 98–111)
Creatinine, Ser: 1.02 mg/dL (ref 0.61–1.24)
GFR, Estimated: >60 mL/min (ref >60)
Glucose, Bld: 100 mg/dL — ABNORMAL HIGH (ref 70–99)
Potassium: 3.3 mmol/L — ABNORMAL LOW (ref 3.5–5.1)
Sodium: 135 mmol/L (ref 135–145)

## 2023-02-19 LAB — LIPASE, BLOOD: Lipase: 136 U/L — ABNORMAL HIGH (ref 11–51)

## 2023-02-19 LAB — CBC
HCT: 46.6 % (ref 39.0–52.0)
Hemoglobin: 15.7 g/dL (ref 13.0–17.0)
MCH: 29.5 pg (ref 26.0–34.0)
MCHC: 33.7 g/dL (ref 30.0–36.0)
MCV: 87.6 fL (ref 80.0–100.0)
Platelets: 207 10*3/uL (ref 150–400)
RBC: 5.32 MIL/uL (ref 4.22–5.81)
RDW: 13.7 % (ref 11.5–15.5)
WBC: 7.3 10*3/uL (ref 4.0–10.5)
nRBC: 0 % (ref 0.0–0.2)

## 2023-02-19 LAB — TROPONIN I (HIGH SENSITIVITY): Troponin I (High Sensitivity): 4 ng/L (ref <18)

## 2023-02-19 MED ORDER — FAMOTIDINE 20 MG PO TABS
20.0000 mg | ORAL_TABLET | Freq: Once | ORAL | Status: AC
  Administered 2023-02-19: 20 mg via ORAL
  Filled 2023-02-19: qty 1

## 2023-02-19 MED ORDER — LIDOCAINE VISCOUS HCL 2 % MT SOLN
15.0000 mL | Freq: Once | OROMUCOSAL | Status: AC
  Administered 2023-02-19: 15 mL via OROMUCOSAL
  Filled 2023-02-19: qty 15

## 2023-02-19 MED ORDER — ALUM & MAG HYDROXIDE-SIMETH 200-200-20 MG/5ML PO SUSP
30.0000 mL | Freq: Once | ORAL | Status: AC
  Administered 2023-02-19: 30 mL via ORAL
  Filled 2023-02-19: qty 30

## 2023-02-19 MED ORDER — FAMOTIDINE 20 MG PO TABS: 20.0000 mg | ORAL_TABLET | Freq: Two times a day (BID) | ORAL | 0 refills | Status: DC

## 2023-02-19 MED ORDER — OMEPRAZOLE MAGNESIUM 20 MG PO TBEC: 40.0000 mg | DELAYED_RELEASE_TABLET | Freq: Every day | ORAL | 2 refills | Status: DC

## 2023-02-19 MED ORDER — IOHEXOL 300 MG/ML  SOLN
100.0000 mL | Freq: Once | INTRAMUSCULAR | Status: AC | PRN
  Administered 2023-02-19: 100 mL via INTRAVENOUS

## 2023-02-19 NOTE — ED Provider Notes (Signed)
CT ABDOMEN PELVIS W CONTRAST  Result Date: 02/19/2023 CLINICAL DATA:  Pancreatitis, acute, severe epigastic pain, n/v, eval for pancreatitis EXAM: CT ABDOMEN AND PELVIS WITH CONTRAST TECHNIQUE: Multidetector CT imaging of the abdomen and pelvis was performed using the standard protocol following bolus administration of intravenous contrast. RADIATION DOSE REDUCTION: This exam was performed according to the departmental dose-optimization program which includes automated exposure control, adjustment of the mA and/or kV according to patient size and/or use of iterative reconstruction technique. CONTRAST:  OMNIPAQUE IOHEXOL 300 MG/ML  SOLN COMPARISON:  Chest XR, concurrent.  US Abdomen, 08/21/2022. FINDINGS: Lower chest: No acute abnormality. Hepatobiliary: No focal liver abnormality is seen. No gallstones, gallbladder wall thickening, or biliary dilatation. Pancreas: Relative prominence of the uncinate and pancreatic head. No pancreatic ductal dilatation or surrounding inflammatory changes. Spleen: Normal in size without focal abnormality. Adrenals/Urinary Tract: Adrenal glands are unremarkable. Kidneys are normal, without renal calculi, focal lesion, or hydronephrosis. Bladder is unremarkable. Stomach/Bowel: Stomach is within normal limits. Appendix appears normal. No evidence of bowel wall thickening, distention, or inflammatory changes. Vascular/Lymphatic: No significant vascular findings are present. No enlarged abdominal or pelvic lymph nodes. Reproductive: Prostate is unremarkable. Other: No abdominal wall hernia or abnormality. No abdominopelvic ascites. Musculoskeletal: Degenerative changes of spine, greatest at L5-S1 with vacuum disc phenomena and endplate degenerative change. No acute osseous findings. IMPRESSION: No acute abdominopelvic process. Electronically Signed   By: Roanna Banning M.D.   On: 02/19/2023 16:33    Patient feeling much improved at the time of discharge.  He is ambulatory without  distress.  I reviewed his CT scan, no obvious acute finding.  Patient comfortable plan to discharge home will utilize Pepcid, antacids/PPI.  Return precautions discussed with patient he is agreeable.  Also recommend reduction in alcohol use.  Return precautions and treatment recommendations and follow-up discussed with the patient who is agreeable with the plan.    Sharyn Creamer, MD 02/19/23 1810

## 2023-02-19 NOTE — ED Triage Notes (Signed)
Pt to ED for chest pain, abdominal pain x2 days. +nausea.

## 2023-02-19 NOTE — ED Provider Notes (Signed)
[facility] Provider Note    Event Date/Time   First MD Initiated Contact with Patient 02/19/23 1241     (approximate)   History   Chest Pain and Abdominal Pain   HPI  Colin Glass is a 37 y.o. male no significant past medical history who presents to the emergency department with abdominal pain.  Patient states that started yesterday he was having epigastric abdominal pain radiating to his chest.  Took some over-the-counter medications yesterday and then today started having worsening pain which brought him into the emergency department.  States that he had a similar episode in the past and was diagnosed with gastritis.  States that he is no longer on a PPI.  Does endorse alcohol use.  Denies any active nausea or vomiting.  Denies blood in his stool or melena.  Denies any significant marijuana use.  Does endorse smoking Black and milds.  Denies prior abdominal surgery.  No family history coronary artery disease at a young age.  No history of DVT or PE.  No recent travel.  Physical Exam   Triage Vital Signs: ED Triage Vitals  Encounter Vitals Group     BP 02/19/23 1234 124/88     Systolic BP Percentile --      Diastolic BP Percentile --      Pulse Rate 02/19/23 1234 71     Resp 02/19/23 1234 18     Temp 02/19/23 1234 97.7 F (36.5 C)     Temp src --      SpO2 02/19/23 1234 98 %     Weight 02/19/23 1231 185 lb (83.9 kg)     Height 02/19/23 1231 6\' 2"  (1.88 m)     Head Circumference --      Peak Flow --      Pain Score 02/19/23 1231 10     Pain Loc --      Pain Education --      Exclude from Growth Chart --     Most recent vital signs: Vitals:   02/19/23 1234  BP: 124/88  Pulse: 71  Resp: 18  Temp: 97.7 F (36.5 C)  SpO2: 98%    Physical Exam Constitutional:      Appearance: He is well-developed.  HENT:     Head: Atraumatic.  Eyes:     Conjunctiva/sclera: Conjunctivae normal.  Cardiovascular:     Rate and Rhythm: Regular rhythm.      Heart sounds: Normal heart sounds.  Pulmonary:     Effort: No respiratory distress.     Breath sounds: No wheezing.  Abdominal:     Tenderness: There is abdominal tenderness.     Comments: Mild epigastric abdominal tenderness to palpation.  No CVA tenderness.  Negative Murphy sign.  Negative McBurney's point.  No right upper quadrant abdominal tenderness.  Musculoskeletal:     Cervical back: Normal range of motion.     Right lower leg: No edema.     Left lower leg: No edema.     Comments: +2 DP and radial pulses that are equal bilaterally.  Skin:    General: Skin is warm.  Neurological:     Mental Status: He is alert. Mental status is at baseline.     IMPRESSION / MDM / ASSESSMENT AND PLAN / ED COURSE  I reviewed the triage vital signs and the nursing notes.  Differential diagnosis including gastritis/PUD, pancreatitis, ACS, anemia, pneumonia, pericarditis.  Low suspicion for pulmonary embolism, low risk Wells criteria,  PERC negative.  Low suspicion for dissection, no tearing chest pain, pulses are equal, no risk factors.  EKG  I, Corena Herter, the attending physician, personally viewed and interpreted this ECG.   Rate: Normal  Rhythm: Normal sinus  Axis: Normal  Intervals: Normal  ST&T Change: None.  Findings of benign early repolarization  No tachycardic or bradycardic dysrhythmias while on cardiac telemetry.  RADIOLOGY I independently reviewed imaging, my interpretation of imaging: Chest x-ray no signs of pneumonia.  No widened mediastinum.  CT abdomen and pelvis with contrast -on my evaluation no obvious signs of small bowel obstruction, no obvious intra-abdominal abscess or pancreatitis.  Read is currently pending.  LABS (all labs ordered are listed, but only abnormal results are displayed) Labs interpreted as -    Labs Reviewed  BASIC METABOLIC PANEL - Abnormal; Notable for the following components:      Result Value   Potassium 3.3 (*)    Chloride 96 (*)     Glucose, Bld 100 (*)    All other components within normal limits  LIPASE, BLOOD - Abnormal; Notable for the following components:   Lipase 136 (*)    All other components within normal limits  CBC  TROPONIN I (HIGH SENSITIVITY)     MDM    No significant leukocytosis.  Creatinine at baseline.  No significant electrolyte abnormality.  Lipase mildly elevated at 136.  Troponin is negative.  Patient was given Prilosec, GI cocktail  Given the patient's mildly elevated lipase and history of alcohol use will obtain CT scan of the abdomen and pelvis for further evaluation.  EKG and clinical picture is not consistent does not meet criteria for acute pericarditis.  On reevaluation patient states that he is feeling better.  Able to tolerate p.o.  Low risk heart score, troponin is negative, have a low suspicion for ACS.  Discussed symptomatic treatment.  Discussed food for peptic ulcer disease/gastritis.  Discussed close follow-up with primary care physician and the likely need to follow-up closely with gastroenterology for possible endoscopy and further workup.  Discussed avoiding alcohol.  Given return precautions for any ongoing or worsening symptoms.   PROCEDURES:  Critical Care performed: No  Procedures  Patient's presentation is most consistent with acute presentation with potential threat to life or bodily function.   MEDICATIONS ORDERED IN ED: Medications  famotidine (PEPCID) tablet 20 mg (20 mg Oral Given 02/19/23 1315)  alum & mag hydroxide-simeth (MAALOX/MYLANTA) 200-200-20 MG/5ML suspension 30 mL (30 mLs Oral Given 02/19/23 1316)  lidocaine (XYLOCAINE) 2 % viscous mouth solution 15 mL (15 mLs Mouth/Throat Given 02/19/23 1316)  iohexol (OMNIPAQUE) 300 MG/ML solution 100 mL (100 mLs Intravenous Contrast Given 02/19/23 1417)    FINAL CLINICAL IMPRESSION(S) / ED DIAGNOSES   Final diagnoses:  Epigastric pain  Acute gastritis without hemorrhage, unspecified gastritis type      Rx / DC Orders   ED Discharge Orders          Ordered    famotidine (PEPCID) 20 MG tablet  2 times daily,   Status:  Discontinued        02/19/23 1341    omeprazole (PRILOSEC OTC) 20 MG tablet  Daily        02/19/23 1341    famotidine (PEPCID) 20 MG tablet  2 times daily        02/19/23 1342             Note:  This document was prepared using Dragon voice recognition software and may  include unintentional dictation errors.   Corena Herter, MD 02/19/23 [phone]

## 2023-02-19 NOTE — ED Notes (Signed)
See triage notes. Patient c/o chest and abdominal pain with nausea times two days.

## 2023-03-03 ENCOUNTER — Emergency Department
Admission: EM | Admit: 2023-03-03 | Discharge: 2023-03-03 | Disposition: A | Discharge status: Home / Self Care | Payer: 59 | Attending: Emergency Medicine | Admitting: Emergency Medicine

## 2023-03-03 ENCOUNTER — Emergency Department: Payer: 59

## 2023-03-03 DIAGNOSIS — F10920 Alcohol use, unspecified with intoxication, uncomplicated: Secondary | ICD-10-CM

## 2023-03-03 DIAGNOSIS — F10129 Alcohol abuse with intoxication, unspecified: Secondary | ICD-10-CM | POA: Diagnosis present

## 2023-03-03 DIAGNOSIS — Y908 Blood alcohol level of 240 mg/100 ml or more: Secondary | ICD-10-CM | POA: Insufficient documentation

## 2023-03-03 DIAGNOSIS — W19XXXA Unspecified fall, initial encounter: Secondary | ICD-10-CM | POA: Diagnosis not present

## 2023-03-03 LAB — CBC WITH DIFFERENTIAL/PLATELET
Abs Immature Granulocytes: 0.02 10*3/uL (ref 0.00–0.07)
Basophils Absolute: 0.1 10*3/uL (ref 0.0–0.1)
Basophils Relative: 1 %
Eosinophils Absolute: 0.1 10*3/uL (ref 0.0–0.5)
Eosinophils Relative: 2 %
HCT: 44.9 % (ref 39.0–52.0)
Hemoglobin: 15 g/dL (ref 13.0–17.0)
Immature Granulocytes: 0 %
Lymphocytes Relative: 27 %
Lymphs Abs: 1.7 10*3/uL (ref 0.7–4.0)
MCH: 29.2 pg (ref 26.0–34.0)
MCHC: 33.4 g/dL (ref 30.0–36.0)
MCV: 87.4 fL (ref 80.0–100.0)
Monocytes Absolute: 0.4 10*3/uL (ref 0.1–1.0)
Monocytes Relative: 7 %
Neutro Abs: 3.8 10*3/uL (ref 1.7–7.7)
Neutrophils Relative %: 63 %
Platelets: 238 10*3/uL (ref 150–400)
RBC: 5.14 MIL/uL (ref 4.22–5.81)
RDW: 14.4 % (ref 11.5–15.5)
WBC: 6.1 10*3/uL (ref 4.0–10.5)
nRBC: 0 % (ref 0.0–0.2)

## 2023-03-03 LAB — COMPREHENSIVE METABOLIC PANEL
ALT: 51 U/L — ABNORMAL HIGH (ref 0–44)
AST: 70 U/L — ABNORMAL HIGH (ref 15–41)
Albumin: 4.8 g/dL (ref 3.5–5.0)
Alkaline Phosphatase: 94 U/L (ref 38–126)
Anion gap: 8 (ref 5–15)
BUN: 9 mg/dL (ref 6–20)
CO2: 30 mmol/L (ref 22–32)
Calcium: 8.7 mg/dL — ABNORMAL LOW (ref 8.9–10.3)
Chloride: 102 mmol/L (ref 98–111)
Creatinine, Ser: 1.1 mg/dL (ref 0.61–1.24)
GFR, Estimated: >60 mL/min (ref >60)
Glucose, Bld: 101 mg/dL — ABNORMAL HIGH (ref 70–99)
Potassium: 3.4 mmol/L — ABNORMAL LOW (ref 3.5–5.1)
Sodium: 140 mmol/L (ref 135–145)
Total Bilirubin: 0.9 mg/dL (ref 0.3–1.2)
Total Protein: 7.9 g/dL (ref 6.5–8.1)

## 2023-03-03 LAB — ETHANOL: Alcohol, Ethyl (B): 389 mg/dL (ref <10)

## 2023-03-03 NOTE — ED Notes (Addendum)
Pt advised his chest is hurting, spoke with Dr. Katrinka Blazing since RN was in room with another pt. Advised pt EKG was done 30 mins prior to the chest pain. Dr. Katrinka Blazing advised no concerns to repeat another EKG. RN notified.

## 2023-03-03 NOTE — ED Triage Notes (Signed)
Patient BIB [facility] from home after patient reports drinking  a lot of Alcohol and fell in the bathroom. Patient states that he does not " feel right" Patient denies pain at this time. BG 155

## 2023-03-03 NOTE — ED Provider Notes (Signed)
[facility] Provider Note    Event Date/Time   First MD Initiated Contact with Patient 03/03/23 0021     (approximate)   History   Alcohol Intoxication   HPI  Colin Glass is a 37 y.o. male who presents to the ED for evaluation of Alcohol Intoxication   Patient presents to the ED after collapsing at home.  He reports he was drinking Personal assistant, "a lot," because it is his deceased grandmother's birthday and they were celebrating.  No other recreational drugs beyond alcohol.  He reports getting up to walk to the bathroom and "just collapsed."  He does not think he lost consciousness but is uncertain.  Reports he just fell to the ground, it was weird and so he comes to the ED to get checked out.   Physical Exam   Triage Vital Signs: ED Triage Vitals  Encounter Vitals Group     BP      Systolic BP Percentile      Diastolic BP Percentile      Pulse      Resp      Temp      Temp src      SpO2      Weight      Height      Head Circumference      Peak Flow      Pain Score      Pain Loc      Pain Education      Exclude from Growth Chart     Most recent vital signs: Vitals:   03/03/23 0029  BP: 130/87  Pulse: 74  Resp: 20  Temp: 97.9 F (36.6 C)  SpO2: 98%    General: Awake, no distress.  Intoxicated but pleasant and conversational.  Moving all 4.  No distress CV:  Good peripheral perfusion.  Resp:  Normal effort.  Abd:  No distention.  Soft MSK:  No deformity noted.  Neuro:  No focal deficits appreciated. Cranial nerves II through XII intact 5/5 strength and sensation in all 4 extremities Other:     ED Results / Procedures / Treatments   Labs (all labs ordered are listed, but only abnormal results are displayed) Labs Reviewed  COMPREHENSIVE METABOLIC PANEL - Abnormal; Notable for the following components:      Result Value   Potassium 3.4 (*)    Glucose, Bld 101 (*)    Calcium 8.7 (*)    AST 70 (*)    ALT 51 (*)     All other components within normal limits  ETHANOL - Abnormal; Notable for the following components:   Alcohol, Ethyl (B) 389 (*)    All other components within normal limits  CBC WITH DIFFERENTIAL/PLATELET    EKG Sinus rhythm with a rate of 90 bpm.  Normal axis.  Borderline first-degree AV block with PR of 210, otherwise normal intervals.  No evidence of acute ischemia.  RADIOLOGY CT head interpreted by me without evidence of acute intracranial pathology  Official radiology report(s): CT HEAD WO CONTRAST ( )  Result Date: 03/03/2023 CLINICAL DATA:  Syncope EXAM: CT HEAD WITHOUT CONTRAST TECHNIQUE: Contiguous axial images were obtained from the base of the skull through the vertex without intravenous contrast. RADIATION DOSE REDUCTION: This exam was performed according to the departmental dose-optimization program which includes automated exposure control, adjustment of the mA and/or kV according to patient size and/or use of iterative reconstruction technique. COMPARISON:  Head CT 09/24/2015.  MRI  brain 07/15/2019. FINDINGS: Brain: No evidence of acute infarction, hemorrhage, hydrocephalus, extra-axial collection or mass lesion/mass effect. Vascular: No hyperdense vessel or unexpected calcification. Skull: Normal. Negative for fracture or focal lesion. Sinuses/Orbits: There is mucosal thickening of ethmoid air cells and frontal sinuses. The orbits are within normal limits. Other: None. IMPRESSION: No acute intracranial abnormality. Electronically Signed   By: Darliss Cheney M.D.   On: 03/03/2023 00:42    PROCEDURES and INTERVENTIONS:  .1-3 Lead EKG Interpretation  Performed by: Delton Prairie, MD Authorized by: Delton Prairie, MD     Interpretation: normal     ECG rate:  70   ECG rate assessment: normal     Rhythm: sinus rhythm     Ectopy: none     Conduction: normal     Medications - No data to display   IMPRESSION / MDM / ASSESSMENT AND PLAN / ED COURSE  I reviewed the triage  vital signs and the nursing notes.  Differential diagnosis includes, but is not limited to, cardiac dysrhythmia, seizure, syncope, vasovagal episode, polysubstance abuse, overdose  {Patient presents with symptoms of an acute illness or injury that is potentially life-threatening.  Patient presents to the ED acutely intoxicated with a benign workup and suitable for outpatient management with family.  Normal vitals and exam.  No signs of trauma, distress or deficits.  Normal CBC and essentially normal metabolic panel.  Marginal LFT derangements likely due to his acute intoxication.  Ethanol level of 389.  CT head and cardiac testing are benign.  I considered observation admission for this patient for possible syncope, but he would prefer discharge with family which I think is reasonable.  We discussed return precautions.  Clinical Course as of 03/03/23 [name] Mar 03, 2023  0206 Patient calling out for discharge.  He is here with his mother and wife who will go home with him and can keep an eye on him.  He reports feeling much better and is appreciative for what we have done.  We discussed return precautions [DS]    Clinical Course User Index [DS] Delton Prairie, MD     FINAL CLINICAL IMPRESSION(S) / ED DIAGNOSES   Final diagnoses:  Alcoholic intoxication without complication (HCC)  Fall, initial encounter     Rx / DC Orders   ED Discharge Orders     None        Note:  This document was prepared using Dragon voice recognition software and may include unintentional dictation errors.   Delton Prairie, MD 03/03/23 [phone]

## 2023-03-20 ENCOUNTER — Emergency Department
Admission: EM | Admit: 2023-03-20 | Discharge: 2023-03-20 | Disposition: A | Discharge status: Home / Self Care | Payer: 59 | Attending: Emergency Medicine | Admitting: Emergency Medicine

## 2023-03-20 DIAGNOSIS — R1013 Epigastric pain: Secondary | ICD-10-CM | POA: Insufficient documentation

## 2023-03-20 DIAGNOSIS — R748 Abnormal levels of other serum enzymes: Secondary | ICD-10-CM | POA: Insufficient documentation

## 2023-03-20 DIAGNOSIS — R197 Diarrhea, unspecified: Secondary | ICD-10-CM | POA: Diagnosis not present

## 2023-03-20 LAB — COMPREHENSIVE METABOLIC PANEL
ALT: 45 U/L — ABNORMAL HIGH (ref 0–44)
AST: 45 U/L — ABNORMAL HIGH (ref 15–41)
Albumin: 4.6 g/dL (ref 3.5–5.0)
Alkaline Phosphatase: 88 U/L (ref 38–126)
Anion gap: 10 (ref 5–15)
BUN: 10 mg/dL (ref 6–20)
CO2: 28 mmol/L (ref 22–32)
Calcium: 9.4 mg/dL (ref 8.9–10.3)
Chloride: 98 mmol/L (ref 98–111)
Creatinine, Ser: 1.06 mg/dL (ref 0.61–1.24)
GFR, Estimated: >60 mL/min (ref >60)
Glucose, Bld: 88 mg/dL (ref 70–99)
Potassium: 4.2 mmol/L (ref 3.5–5.1)
Sodium: 136 mmol/L (ref 135–145)
Total Bilirubin: 1 mg/dL (ref <1.2)
Total Protein: 7.8 g/dL (ref 6.5–8.1)

## 2023-03-20 LAB — URINALYSIS, ROUTINE W REFLEX MICROSCOPIC
Bilirubin Urine: NEGATIVE
Glucose, UA: NEGATIVE mg/dL
Hgb urine dipstick: NEGATIVE
Ketones, ur: NEGATIVE mg/dL
Leukocytes,Ua: NEGATIVE
Nitrite: NEGATIVE
Protein, ur: NEGATIVE mg/dL
Specific Gravity, Urine: 1.024 (ref 1.005–1.030)
pH: 6 (ref 5.0–8.0)

## 2023-03-20 LAB — CBC
HCT: 47.3 % (ref 39.0–52.0)
Hemoglobin: 16.1 g/dL (ref 13.0–17.0)
MCH: 29.8 pg (ref 26.0–34.0)
MCHC: 34 g/dL (ref 30.0–36.0)
MCV: 87.6 fL (ref 80.0–100.0)
Platelets: 226 10*3/uL (ref 150–400)
RBC: 5.4 MIL/uL (ref 4.22–5.81)
RDW: 14.4 % (ref 11.5–15.5)
WBC: 6.5 10*3/uL (ref 4.0–10.5)
nRBC: 0 % (ref 0.0–0.2)

## 2023-03-20 LAB — LIPASE, BLOOD: Lipase: 90 U/L — ABNORMAL HIGH (ref 11–51)

## 2023-03-20 MED ORDER — DICYCLOMINE HCL 10 MG PO CAPS: 10.0000 mg | ORAL_CAPSULE | Freq: Three times a day (TID) | ORAL | 0 refills | Status: DC | PRN

## 2023-03-20 MED ORDER — PANTOPRAZOLE SODIUM 40 MG PO TBEC
40.0000 mg | DELAYED_RELEASE_TABLET | Freq: Every day | ORAL | 1 refills | Status: DC
Start: 2023-03-20 — End: 2023-08-21

## 2023-03-20 NOTE — ED Provider Notes (Signed)
[facility] Provider Note    Event Date/Time   First MD Initiated Contact with Patient 03/20/23 1539     (approximate)   History   Abdominal Pain   HPI  Colin Glass is a 37 y.o. male who presents to the emergency department today because of concern for abdominal pain.  Located in the epigastric region.  Started about 4 days ago.  Says it is intermittent.  It will be a sharp pain when it comes.  This has been accompanied by frequent diarrhea.  He has not noticed any blood in his diarrhea.  The patient denies any unusual ingestions prior to the symptoms starting.  Denies any excessive alcohol use prior to the symptoms starting.  No known sick contacts.     Physical Exam   Triage Vital Signs: ED Triage Vitals [03/20/23 1425]  Encounter Vitals Group     BP (!) 122/98     Systolic BP Percentile      Diastolic BP Percentile      Pulse Rate 92     Resp 14     Temp 98.2 F (36.8 C)     Temp src      SpO2 97 %     Weight      Height      Head Circumference      Peak Flow      Pain Score 0     Pain Loc      Pain Education      Exclude from Growth Chart     Most recent vital signs: Vitals:   03/20/23 1425  BP: (!) 122/98  Pulse: 92  Resp: 14  Temp: 98.2 F (36.8 C)  SpO2: 97%   General: Awake, alert, oriented. CV:  Good peripheral perfusion. Regular rate and rhythm. Resp:  Normal effort. Lungs clear. Abd:  No distention. Non tender.   ED Results / Procedures / Treatments   Labs (all labs ordered are listed, but only abnormal results are displayed) Labs Reviewed  LIPASE, BLOOD - Abnormal; Notable for the following components:      Result Value   Lipase 90 (*)    All other components within normal limits  COMPREHENSIVE METABOLIC PANEL - Abnormal; Notable for the following components:   AST 45 (*)    ALT 45 (*)    All other components within normal limits  URINALYSIS, ROUTINE W REFLEX MICROSCOPIC - Abnormal; Notable for the  following components:   Color, Urine YELLOW (*)    APPearance CLEAR (*)    All other components within normal limits  CBC     EKG  None   RADIOLOGY None  PROCEDURES:  Critical Care performed: No   MEDICATIONS ORDERED IN ED: Medications - No data to display   IMPRESSION / MDM / ASSESSMENT AND PLAN / ED COURSE  I reviewed the triage vital signs and the nursing notes.                              Differential diagnosis includes, but is not limited to, gastritis, pancreatitis, hepatitis, gastroenteritis  Patient's presentation is most consistent with acute presentation with potential threat to life or bodily function.   Patient presented to the emergency department today because of concerns for abdominal pain.  On exam patient's abdomen is benign.  Blood work without any concerning leukocytosis.  Very mild elevation of his lipase.  At this time given  benign exam and overall reassuring blood work I think it is reasonable for patient be discharged.  Will give the medication to help with his symptoms.  Given where he is complaining of the pain could potentially be very mild pancreatitis or gastritis.  Discussed this with the patient.   FINAL CLINICAL IMPRESSION(S) / ED DIAGNOSES   Final diagnoses:  Epigastric pain  Diarrhea, unspecified type      Note:  This document was prepared using Dragon voice recognition software and may include unintentional dictation errors.    Phineas Semen, MD 03/20/23 [phone]

## 2023-03-20 NOTE — ED Triage Notes (Signed)
Pt reports intermittent abd pain for the last 3-4 days with diarrhea and nausea.

## 2023-03-20 NOTE — Discharge Instructions (Signed)
Please seek medical attention for any high fevers, chest pain, shortness of breath, change in behavior, persistent vomiting, bloody stool or any other new or concerning symptoms.  

## 2023-08-15 ENCOUNTER — Other Ambulatory Visit: Payer: Self-pay

## 2023-08-15 ENCOUNTER — Emergency Department

## 2023-08-15 ENCOUNTER — Encounter: Payer: Self-pay | Admitting: Emergency Medicine

## 2023-08-15 DIAGNOSIS — M25511 Pain in right shoulder: Secondary | ICD-10-CM | POA: Insufficient documentation

## 2023-08-15 DIAGNOSIS — M542 Cervicalgia: Secondary | ICD-10-CM | POA: Insufficient documentation

## 2023-08-15 NOTE — ED Triage Notes (Signed)
 Pt presents to the ED via POV with complaints of neck, R shoulder, and R arm pain x 1 month. Pt endorses some relief when lying on his right side to relieve pressure. No meds taken PTA. Endorses some ETOH tonight " to help". A&Ox4 at this time. Denies CP or SOB.

## 2023-08-16 ENCOUNTER — Emergency Department
Admission: EM | Admit: 2023-08-16 | Discharge: 2023-08-16 | Disposition: A | Discharge status: Home / Self Care | Attending: Emergency Medicine | Admitting: Emergency Medicine

## 2023-08-16 DIAGNOSIS — M25511 Pain in right shoulder: Secondary | ICD-10-CM

## 2023-08-16 MED ORDER — NAPROXEN 500 MG PO TABS: 500.0000 mg | ORAL_TABLET | Freq: Two times a day (BID) | ORAL | 0 refills | Status: AC

## 2023-08-16 MED ORDER — CYCLOBENZAPRINE HCL 10 MG PO TABS: 10.0000 mg | ORAL_TABLET | Freq: Three times a day (TID) | ORAL | 0 refills | Status: AC | PRN

## 2023-08-16 NOTE — ED Provider Notes (Signed)
 [facility] Provider Note    Event Date/Time   First MD Initiated Contact with Patient 08/16/23 0001     (approximate)   History   Arm Pain   HPI  VYOM BRASS is a 38 year old male presenting to the emergency department for evaluation of neck and shoulder pain.  Patient reports that for the past several months he has had discomfort over his right neck radiating into his upper arm.  No identifiable history of trauma.  No midline neck pain.  In triage noted alcohol use to help with pain, but tells me he has not taken anything else to help with his pain including Tylenol, ibuprofen.  No chest pain, shortness of breath.  No change in character of his pain recently.  No associated numbness, tingling, focal weakness.  Denies any repetitive motion tasks.     Physical Exam   Triage Vital Signs: ED Triage Vitals  Encounter Vitals Group     BP 08/15/23 2156 (!) 135/100     Systolic BP Percentile --      Diastolic BP Percentile --      Pulse Rate 08/15/23 2156 (!) 116     Resp 08/15/23 2156 20     Temp 08/15/23 2156 97.8 F (36.6 C)     Temp Source 08/15/23 2156 Oral     SpO2 08/15/23 2156 98 %     Weight 08/15/23 2155 170 lb (77.1 kg)     Height 08/15/23 2155 6\' 2"  (1.88 m)     Head Circumference --      Peak Flow --      Pain Score 08/15/23 2155 8     Pain Loc --      Pain Education --      Exclude from Growth Chart --     Most recent vital signs: Vitals:   08/15/23 2156  BP: (!) 135/100  Pulse: (!) 116  Resp: 20  Temp: 97.8 F (36.6 C)  SpO2: 98%     General: Awake, interactive  CV:  Regular rate, good peripheral perfusion.  Resp:  Unlabored respirations, lungs clear auscultation Abd:  Nondistended, soft, nontender Neuro:  Symmetric facial movement, fluid speech, 5 out of 5 strength of the bilateral upper extremities with intact sensation MSK:  No midline neck pain.  Reproducible tenderness to palpation over the right upper back  musculature without focal area of point tenderness.  Patient able to fully range right shoulder without significant discomfort.  Negative Spurling sign bilaterally.   ED Results / Procedures / Treatments   Labs (all labs ordered are listed, but only abnormal results are displayed) Labs Reviewed - No data to display   EKG EKG independently reviewed interpreted by myself (ER attending) demonstrates:    RADIOLOGY Imaging independently reviewed and interpreted by myself demonstrates:  Shoulder x-Shantara Goosby without acute fracture  PROCEDURES:  Critical Care performed: No  Procedures   MEDICATIONS ORDERED IN ED: Medications - No data to display   IMPRESSION / MDM / ASSESSMENT AND PLAN / ED COURSE  I reviewed the triage vital signs and the nursing notes.  Differential diagnosis includes, but is not limited to, musculoskeletal strain, rotator cuff pathology, lower suspicion for fracture, dislocation, no evidence of spine or spinal cord pathology  Patient's presentation is most consistent with acute complicated illness / injury requiring diagnostic workup.  38 year old male presenting with right arm pain.  Initially tachycardic in triage, improved heart rates in the 90s on my initial evaluation.  Presents with ongoing atraumatic arm pain without evidence of neurovascular compromise.  Low suspicion emergent pathology.  Discussed multimodal pain control.  Will DC with course of naproxen and muscle relaxer.  Requested sling for comfort, discussed importance of regularly ranging his arm to prevent contractures.  Will provide as needed information for follow-up with orthopedics.  Strict return precautions provided.  Patient discharged stable condition.      FINAL CLINICAL IMPRESSION(S) / ED DIAGNOSES   Final diagnoses:  Right shoulder pain, unspecified chronicity     Rx / DC Orders   ED Discharge Orders          Ordered    naproxen (NAPROSYN) 500 MG tablet  2 times daily with meals         08/16/23 0032    cyclobenzaprine (FLEXERIL) 10 MG tablet  3 times daily PRN        08/16/23 0032             Note:  This document was prepared using Dragon voice recognition software and may include unintentional dictation errors.   Trinna Post, MD 08/16/23 [phone]

## 2023-08-16 NOTE — Discharge Instructions (Signed)
 You are seen in the ER today for your shoulder and neck pain.  Your x-Tiger Spieker was reassuring.  I sent a prescription for an anti-inflammatory to your pharmacy to take for the next couple weeks.  I also sent a short course of a muscle relaxer.  This can make you drowsy, do not drive or operate machinery when taking this.  You can also try over-the-counter medication such as Tylenol and lidocaine patches to help with your pain.  You can use your sling as needed for comfort, but make sure you are regularly ranging your arm to prevent contractures.  If you have ongoing symptoms, I have included information for follow-up with orthopedics as needed.  Return to the ER for new or worsening symptoms.

## 2023-08-20 ENCOUNTER — Ambulatory Visit: Payer: Self-pay

## 2023-08-20 NOTE — Telephone Encounter (Signed)
  Chief Complaint: high BP reading Symptoms: BP 168/98-asymptomatic-randomly took BP Frequency: today Pertinent Negatives: Patient denies CP, SOB, headache Disposition: [] ED /[] Urgent Care (no appt availability in office) / [x] Appointment(In office/virtual)/ []  [facility]/ [] Home Care/ [] Refused Recommended Disposition /[] [facility]/ []  Follow-up with PCP Additional Notes: patient's wife called for patient to report high BP reading of 168/98. Patient is asymptomatic. Patient wanting to get an office visit to be seen. BP reading was randomly taken because his wife is monitoring her BP for high Blood pressure. Per protocol patient to be seen within three days. Patient scheduled for office visit tomorrow with PCP at 8:20 AM. Verbalized understanding of visit and plans to be there. All questions answered.     Copied from CRM [id number]. Topic: Clinical - Red Word Triage >> Aug 20, 2023 12:12 PM Everette C wrote: [facility] that prompted transfer to Nurse Triage: The patient's BP is currently 168/98 Reason for Disposition  Systolic BP  >= 160 OR Diastolic >= 100  Answer Assessment - Initial Assessment Questions 1. BLOOD PRESSURE: "What is the blood pressure?" "Did you take at least two measurements 5 minutes apart?"     168/98 2. ONSET: "When did you take your blood pressure?"     Just now 3. HOW: "How did you take your blood pressure?" (e.g., automatic home BP monitor, visiting nurse)     Automatic home BP monitor 4. HISTORY: "Do you have a history of high blood pressure?"     no 5. MEDICINES: "Are you taking any medicines for blood pressure?" "Have you missed any doses recently?"     No medications 6. OTHER SYMPTOMS: "Do you have any symptoms?" (e.g., blurred vision, chest pain, difficulty breathing, headache, weakness)     no  Protocols used: Blood Pressure - High-A-AH

## 2023-08-21 ENCOUNTER — Encounter: Payer: Self-pay | Admitting: Family Medicine

## 2023-08-21 ENCOUNTER — Ambulatory Visit (INDEPENDENT_AMBULATORY_CARE_PROVIDER_SITE_OTHER): Payer: PRIVATE HEALTH INSURANCE | Admitting: Family Medicine

## 2023-08-21 VITALS — BP 128/78 | HR 82 | Ht 74.0 in | Wt 176.5 lb

## 2023-08-21 DIAGNOSIS — R030 Elevated blood-pressure reading, without diagnosis of hypertension: Secondary | ICD-10-CM | POA: Diagnosis not present

## 2023-08-21 DIAGNOSIS — K219 Gastro-esophageal reflux disease without esophagitis: Secondary | ICD-10-CM

## 2023-08-21 MED ORDER — SUCRALFATE 1 G PO TABS: 1.0000 g | ORAL_TABLET | Freq: Three times a day (TID) | ORAL | 2 refills | Status: AC

## 2023-08-21 MED ORDER — OMEPRAZOLE 40 MG PO CPDR: 40.0000 mg | DELAYED_RELEASE_CAPSULE | Freq: Every day | ORAL | 2 refills | Status: DC

## 2023-08-21 NOTE — Patient Instructions (Addendum)
 Thank you for coming to the office today.  Keep an eye on BP occasionally, Left arm, arm raised, and relaxed   Starting new stomach acid med today Omeprazole 40mg  daily before breakfast   Take other prescribed medicine Carafate (Sucralfate) as needed up to 4 times daily (3 meals and bedtime)  to coat stomach lining to ease symptoms, if it helps reduce symptoms then it is more likely to be due to acid and/or ulcer.    DIET RECOMMENDATIONS - Avoid spicy, greasy, fried foods, also things like caffeine, dark chocolate, peppermint can worsen - Avoid large meals and late night snacks, also do not go more than 4-5 hours without a snack or meal (not eating will worsen reflux symptoms due to stomach acid) - You may also elevate the head of your bed at night to sleep at very slight incline to help reduce symptoms   If the problem improves but keeps coming back, we can discuss higher dose or longer course at next visit.   Regarding other medicines:   - STOP taking Ibuprofen, Advil, Motrin, Goody's / BC powder - DO NOT take without discussing with your doctor. These medicines can put you at high risk for future bleeding.  If need pain medicine, may take Tylenol Extra Strength (Acetaminophen) 500mg  tabs - take 1 to 2 tabs per dose (max 1000mg ) every 6-8 hours for pain (take regularly, don't skip a dose for next 7 days), max 24 hour daily dose is 6 tablets or 3000mg . In the future you can repeat the same everyday Tylenol course for 1-2 weeks at a time.  Labs after apt.  Please schedule a Follow-up Appointment to: Return in about 6 weeks (around 10/01/2023) for 5/27 9am physical.  If you have any other questions or concerns, please feel free to call the office or send a message through MyChart. You may also schedule an earlier appointment if necessary.  Additionally, you may be receiving a survey about your experience at our office within a few days to 1 week by e-mail or mail. We value your  feedback.  Domingo Friend, DO [facility], [place]

## 2023-08-21 NOTE — Progress Notes (Signed)
 Subjective:    Patient ID: Colin Glass, male    DOB: 03-14-1986, 38 y.o.   MRN: [id number]  Colin Glass is a 38 y.o. male presenting on 08/21/2023 for Elevated BP without diagnosis HTN and Gastroesophageal Reflux   HPI  Discussed the use of AI scribe software for clinical note transcription with the patient, who gave verbal consent to proceed.  Last visit with me 10/2021  History of Present Illness   Colin Glass is a 38 year old male who presents with elevated blood pressure and persistent heartburn.  Elevated BP reading. No diagnosis hypertension He has a recent blood pressure reading at home of 168/98 mmHg taken at home on August 20, 2023, using a wrist or arm cuff on the right arm while sitting on the edge of the bed. No symptoms such as headaches, dizziness, or lightheadedness are present. He does not currently take any medication for blood pressure. He came in because of the high BP reading. - He is asymptomatic Admits higher diet in sodium caffeine, goal to improve  GERD He experiences daily heartburn, particularly after eating greasy foods or lying down after meals. He has been using over-the-counter famotidine, 10mg  x 2 = 20mg  per dose, taking 20 mg TWICE A DAY, but finds it minimally effective. He recalls better symptom control with previous medications such as pantoprazole and omeprazole. He denies current use of naproxen or other NSAIDs, which he associates with stomach discomfort.  He mentions a past shoulder issue for which he was prescribed naproxen and cyclobenzaprine but has not taken them. He experiences shoulder discomfort primarily when playing video games, which occasionally causes the shoulder to 'lock up.'  He is considering a physical examination in the future, which would include a full blood panel.         08/21/2023    8:24 AM 11/01/2021    4:05 PM  Depression screen PHQ 2/9  Decreased Interest 1 2  Down, Depressed, Hopeless 0 0  PHQ - 2 Score 1  2  Altered sleeping 3 1  Tired, decreased energy 1 1  Change in appetite 1 0  Feeling bad or failure about yourself  0 1  Trouble concentrating 0 0  Moving slowly or fidgety/restless 0 0  Suicidal thoughts 0 0  PHQ-9 Score 6 5  Difficult doing work/chores Somewhat difficult Somewhat difficult       08/21/2023    8:25 AM 11/01/2021    4:05 PM  GAD 7 : Generalized Anxiety Score  Nervous, Anxious, on Edge 0 1  Control/stop worrying 1 1  Worry too much - different things 2 1  Trouble relaxing 2 0  Restless 0 0  Easily annoyed or irritable 1 1  Afraid - awful might happen 1 1  Total GAD 7 Score 7 5  Anxiety Difficulty Somewhat difficult Not difficult at all    Social History   Tobacco Use   Smoking status: Former    Current packs/day: 0.00    Average packs/day: 0.5 packs/day for 10.0 years (5.0 ttl pk-yrs)    Types: Cigarettes    Start date: 10/20/2004    Quit date: 10/21/2014    Years since quitting: 8.8   Smokeless tobacco: Never   Tobacco comments:    pt reports currently smoking 1 black and mild a day  Vaping Use   Vaping status: Never Used  Substance Use Topics   Alcohol use: Yes    Alcohol/week: 10.0 standard drinks of alcohol  Types: 10 Standard drinks or equivalent per week    Comment: socially   Drug use: Yes    Types: Marijuana    Review of Systems Per HPI unless specifically indicated above     Objective:    BP 128/78 (BP Location: Left Arm, Cuff Size: Normal)   Pulse 82   Ht 6\' 2"  (1.88 m)   Wt 176 lb 8 oz (80.1 kg)   SpO2 99%   BMI 22.66 kg/m   Wt Readings from Last 3 Encounters:  08/21/23 176 lb 8 oz (80.1 kg)  08/15/23 170 lb (77.1 kg)  03/03/23 185 lb (83.9 kg)    Physical Exam Vitals and nursing note reviewed.  Constitutional:      General: He is not in acute distress.    Appearance: Normal appearance. He is well-developed. He is not diaphoretic.     Comments: Well-appearing, comfortable, cooperative  HENT:     Head: Normocephalic  and atraumatic.  Eyes:     General:        Right eye: No discharge.        Left eye: No discharge.     Conjunctiva/sclera: Conjunctivae normal.  Cardiovascular:     Rate and Rhythm: Normal rate.  Pulmonary:     Effort: Pulmonary effort is normal.  Skin:    General: Skin is warm and dry.     Findings: No erythema or rash.  Neurological:     Mental Status: He is alert and oriented to person, place, and time.  Psychiatric:        Mood and Affect: Mood normal.        Behavior: Behavior normal.        Thought Content: Thought content normal.     Comments: Well groomed, good eye contact, normal speech and thoughts     Results for orders placed or performed during the hospital encounter of 03/20/23  Lipase, blood   Collection Time: 03/20/23  2:27 PM  Result Value Ref Range   Lipase 90 (H) 11 - 51 U/L  Comprehensive metabolic panel   Collection Time: 03/20/23  2:27 PM  Result Value Ref Range   Sodium 136 135 - 145 mmol/L   Potassium 4.2 3.5 - 5.1 mmol/L   Chloride 98 98 - 111 mmol/L   CO2 28 22 - 32 mmol/L   Glucose, Bld 88 70 - 99 mg/dL   BUN 10 6 - 20 mg/dL   Creatinine, Ser 0.86 0.61 - 1.24 mg/dL   Calcium 9.4 8.9 - 57.8 mg/dL   Total Protein 7.8 6.5 - 8.1 g/dL   Albumin 4.6 3.5 - 5.0 g/dL   AST 45 (H) 15 - 41 U/L   ALT 45 (H) 0 - 44 U/L   Alkaline Phosphatase 88 38 - 126 U/L   Total Bilirubin 1.0 <1.2 mg/dL   GFR, Estimated >46 >96 mL/min   Anion gap 10 5 - 15  CBC   Collection Time: 03/20/23  2:27 PM  Result Value Ref Range   WBC 6.5 4.0 - 10.5 K/uL   RBC 5.40 4.22 - 5.81 MIL/uL   Hemoglobin 16.1 13.0 - 17.0 g/dL   HCT 29.5 28.4 - 13.2 %   MCV 87.6 80.0 - 100.0 fL   MCH 29.8 26.0 - 34.0 pg   MCHC 34.0 30.0 - 36.0 g/dL   RDW 44.0 10.2 - 72.5 %   Platelets 226 150 - 400 K/uL   nRBC 0.0 0.0 - 0.2 %  Urinalysis, Routine w  reflex microscopic -Urine, Clean Catch   Collection Time: 03/20/23  2:27 PM  Result Value Ref Range   Color, Urine YELLOW (A) YELLOW    APPearance CLEAR (A) CLEAR   Specific Gravity, Urine 1.024 1.005 - 1.030   pH 6.0 5.0 - 8.0   Glucose, UA NEGATIVE NEGATIVE mg/dL   Hgb urine dipstick NEGATIVE NEGATIVE   Bilirubin Urine NEGATIVE NEGATIVE   Ketones, ur NEGATIVE NEGATIVE mg/dL   Protein, ur NEGATIVE NEGATIVE mg/dL   Nitrite NEGATIVE NEGATIVE   Leukocytes,Ua NEGATIVE NEGATIVE      Assessment & Plan:   Problem List Items Addressed This Visit   None Visit Diagnoses       Gastroesophageal reflux disease, unspecified whether esophagitis present    -  Primary   Relevant Medications   omeprazole (PRILOSEC) 40 MG capsule   sucralfate (CARAFATE) 1 g tablet     Elevated BP without diagnosis of hypertension            Gastroesophageal Reflux Disease (GERD) Current famotidine OTC 10mg  x 2 = 20mg  BID ineffective.  Previously on PPI Omeprazole 40 mg daily planned for better control. Discussed sucralfate for acute symptoms and dietary modifications. Omeprazole requires 1-2 weeks for efficacy.  - Prescribe omeprazole 40 mg daily before breakfast. - Prescribe sucralfate as needed for acute heartburn symptoms. - Advise dietary modifications to reduce greasy food intake and avoid eating before lying down. - Consider referral to gastroenterology if symptoms persist despite treatment.  Elevated BP without HTN Home BP 168/98 mmHg (question accuracy wrist cuff and improper technique 1 reading only R arm) in-office 130/88 mmHg and 128/78 mmHg. Emphasized proper measurement technique. Not on medication. Discussed lifestyle modifications. - Monitor blood pressure at home, ensuring proper technique and positioning. - Encourage lifestyle modifications, including reducing sodium and caffeine intake. - Reassess blood pressure at the next visit.  General Health Maintenance Agreed to schedule physical exam with full blood panel.  - Schedule a physical examination with a full blood panel, including cholesterol, kidney, liver, and blood  glucose tests. - Include HIV and hepatitis C screening in the blood panel.        No orders of the defined types were placed in this encounter.   Meds ordered this encounter  Medications   omeprazole (PRILOSEC) 40 MG capsule    Sig: Take 1 capsule (40 mg total) by mouth daily.    Dispense:  30 capsule    Refill:  2   sucralfate (CARAFATE) 1 g tablet    Sig: Take 1 tablet (1 g total) by mouth 4 (four) times daily -  with meals and at bedtime. As needed. For heartburn    Dispense:  40 tablet    Refill:  2    Follow up plan: Return in about 6 weeks (around 10/01/2023) for 5/27 9am physical.  Future labs ordered after visit 10/01/23 including screening routine HIV add Hep C   Domingo Friend, DO [facility] 08/21/2023, 8:44 AM

## 2023-10-01 ENCOUNTER — Encounter: Payer: PRIVATE HEALTH INSURANCE | Admitting: Family Medicine

## 2023-11-05 ENCOUNTER — Ambulatory Visit (INDEPENDENT_AMBULATORY_CARE_PROVIDER_SITE_OTHER): Payer: PRIVATE HEALTH INSURANCE | Admitting: Family Medicine

## 2023-11-05 ENCOUNTER — Encounter: Payer: Self-pay | Admitting: Family Medicine

## 2023-11-05 VITALS — BP 120/70 | HR 86 | Ht 74.0 in | Wt 177.1 lb

## 2023-11-05 DIAGNOSIS — K219 Gastro-esophageal reflux disease without esophagitis: Secondary | ICD-10-CM

## 2023-11-05 DIAGNOSIS — Z13220 Encounter for screening for lipoid disorders: Secondary | ICD-10-CM | POA: Diagnosis not present

## 2023-11-05 DIAGNOSIS — Z1159 Encounter for screening for other viral diseases: Secondary | ICD-10-CM | POA: Diagnosis not present

## 2023-11-05 DIAGNOSIS — E7800 Pure hypercholesterolemia, unspecified: Secondary | ICD-10-CM

## 2023-11-05 DIAGNOSIS — Z0000 Encounter for general adult medical examination without abnormal findings: Secondary | ICD-10-CM

## 2023-11-05 DIAGNOSIS — Z114 Encounter for screening for human immunodeficiency virus [HIV]: Secondary | ICD-10-CM

## 2023-11-05 DIAGNOSIS — R7309 Other abnormal glucose: Secondary | ICD-10-CM

## 2023-11-05 MED ORDER — OMEPRAZOLE 40 MG PO CPDR: 40.0000 mg | DELAYED_RELEASE_CAPSULE | Freq: Every day | ORAL | 3 refills | Status: AC

## 2023-11-05 NOTE — Patient Instructions (Addendum)
 Thank you for coming to the office today.  Continue Omeprazole  40mg  daily before breakfast every day, 1 year ordered.  In future we can consider reducing.  BP Looks great. Keep an eye on this in future. If you check readings.  Labs tomorrow. Results on mychart.  Consider Melatonin OTC supplement.  Sleep Hygiene Recommendations to promote healthy sleep in all patients, especially if symptoms of insomnia are worsening. Due to the nature of sleep rhythms, if your body gets out of rhythm, it may take some time before your sleep cycle can be reset.  Please try to follow as many of the following tips as you can, usually there are only a few of these are the primary cause of the problem.  ?To reset your sleep rhythm, go to bed and get up at the same time every day ?Sleep only long enough to feel rested and then get out of bed ?Do not try to force yourself to sleep. If you can't sleep, get out of bed and try again later. ?Avoid naps during the day, unless excessively tired. The more sleeping during the day, then the less sleep your body needs at night.  ?Have coffee, tea, and other foods that have caffeine only in the morning ?Exercise several days a week, but not right before bed ?If you drink alcohol, prefer to have appropriate drink with one meal, but prefer to avoid alcohol in the evening, and bedtime ?If you smoke, avoid smoking, especially in the evening  ?Avoid watching TV or looking at phones, computers, or reading devices (e-books) that give off light at least 30 minutes before bed. This artificial light sends awake signals to your brain and can make it harder to fall asleep. ?Make your bedroom a comfortable place where it is easy to fall asleep: Put up shades or special blackout curtains to block light from outside. Use a white noise machine to block noise. Keep the temperature cool. ?Try your best to solve or at least address your problems before you go to bed ?Use  relaxation techniques to manage stress. Ask your health care provider to suggest some techniques that may work well for you. These may include: Breathing exercises. Routines to release muscle tension. Visualizing peaceful scenes.   Please schedule a Follow-up Appointment to: Return for 1 year fasting lab > 1 week later Annual Physical.  If you have any other questions or concerns, please feel free to call the office or send a message through MyChart. You may also schedule an earlier appointment if necessary.  Additionally, you may be receiving a survey about your experience at our office within a few days to 1 week by e-mail or mail. We value your feedback.  Marsa Officer, DO [facility], [place]

## 2023-11-05 NOTE — Progress Notes (Unsigned)
 Subjective:    Patient ID: Colin Glass, male    DOB: Jul 29, 1985, 38 y.o.   MRN: [id number]  Colin Glass is a 38 y.o. male presenting on 11/05/2023 for Annual Exam   HPI  Discussed the use of AI scribe software for clinical note transcription with the patient, who gave verbal consent to proceed.  History of Present Illness   ***  Elevated BP reading. No diagnosis hypertension He has a recent blood pressure reading at home of 168/98 mmHg taken at home on August 20, 2023, using a wrist or arm cuff on the right arm while sitting on the edge of the bed. No symptoms such as headaches, dizziness, or lightheadedness are present. He does not currently take any medication for blood pressure. He came in because of the high BP reading. - He is asymptomatic Admits higher diet in sodium caffeine, goal to improve   GERD He experiences daily heartburn, particularly after eating greasy foods or lying down after meals. He has been using over-the-counter famotidine , 10mg  x 2 = 20mg  per dose, taking 20 mg TWICE A DAY, but finds it minimally effective. He recalls better symptom control with previous medications such as pantoprazole  and omeprazole . He denies current use of naproxen  or other NSAIDs, which he associates with stomach discomfort.   He mentions a past shoulder issue for which he was prescribed naproxen  and cyclobenzaprine  but has not taken them. He experiences shoulder discomfort primarily when playing video games, which occasionally causes the shoulder to 'lock up.'   He is considering a physical examination in the future, which would include a full blood panel.    Health Maintenance: ***     11/05/2023    1:27 PM 08/21/2023    8:24 AM 11/01/2021    4:05 PM  Depression screen PHQ 2/9  Decreased Interest 1 1 2   Down, Depressed, Hopeless 0 0 0  PHQ - 2 Score 1 1 2   Altered sleeping 1 3 1   Tired, decreased energy 0 1 1  Change in appetite  1 0  Feeling bad or failure about yourself   0 0 1  Trouble concentrating 0 0 0  Moving slowly or fidgety/restless 0 0 0  Suicidal thoughts 0 0 0  PHQ-9 Score 2 6 5   Difficult doing work/chores Not difficult at all Somewhat difficult Somewhat difficult       11/05/2023    1:27 PM 08/21/2023    8:25 AM 11/01/2021    4:05 PM  GAD 7 : Generalized Anxiety Score  Nervous, Anxious, on Edge 0 0 1  Control/stop worrying 0 1 1  Worry too much - different things 1 2 1   Trouble relaxing 1 2 0  Restless 0 0 0  Easily annoyed or irritable 1 1 1   Afraid - awful might happen 0 1 1  Total GAD 7 Score 3 7 5   Anxiety Difficulty Not difficult at all Somewhat difficult Not difficult at all     Past Medical History:  Diagnosis Date   Allergy    GERD (gastroesophageal reflux disease)    Past Surgical History:  Procedure Laterality Date   DENTAL SURGERY     MOUTH SURGERY     Social History   Socioeconomic History   Marital status: Single    Spouse name: Not on file   Number of children: Not on file   Years of education: Not on file   Highest education level: Not on file  Occupational History  Not on file  Tobacco Use   Smoking status: Former    Current packs/day: 0.00    Average packs/day: 0.5 packs/day for 10.0 years (5.0 ttl pk-yrs)    Types: Cigarettes    Start date: 10/20/2004    Quit date: 10/21/2014    Years since quitting: 9.0   Smokeless tobacco: Never   Tobacco comments:    pt reports currently smoking 1 black and mild a day  Vaping Use   Vaping status: Never Used  Substance and Sexual Activity   Alcohol use: Yes    Alcohol/week: 10.0 standard drinks of alcohol    Types: 10 Standard drinks or equivalent per week    Comment: socially   Drug use: Yes    Types: Marijuana   Sexual activity: Not on file  Other Topics Concern   Not on file  Social History Narrative   Not on file   Social Drivers of Health   Financial Resource Strain: Not on file  Food Insecurity: Not on file  Transportation Needs: Not on file   Physical Activity: Not on file  Stress: Not on file  Social Connections: Not on file  Intimate Partner Violence: Not on file   History reviewed. No pertinent family history. Current Outpatient Medications on File Prior to Visit  Medication Sig   cyclobenzaprine  (FLEXERIL ) 10 MG tablet Take 1 tablet (10 mg total) by mouth 3 (three) times daily as needed for muscle spasms.   fluticasone  (FLONASE ) 50 MCG/ACT nasal spray Place 2 sprays into both nostrils daily.   sucralfate  (CARAFATE ) 1 g tablet Take 1 tablet (1 g total) by mouth 4 (four) times daily -  with meals and at bedtime. As needed. For heartburn   albuterol  (PROVENTIL  HFA;VENTOLIN  HFA) 108 (90 Base) MCG/ACT inhaler Inhale 2 puffs into the lungs every 6 (six) hours as needed for wheezing or shortness of breath. (Patient not taking: Reported on 11/05/2023)   No current facility-administered medications on file prior to visit.    Review of Systems Per HPI unless specifically indicated above     Objective:    BP 120/70 (BP Location: Right Arm, Patient Position: Sitting, Cuff Size: Normal)   Pulse 86   Ht 6' 2 (1.88 m)   Wt 177 lb 2 oz (80.3 kg)   SpO2 97%   BMI 22.74 kg/m   Wt Readings from Last 3 Encounters:  11/05/23 177 lb 2 oz (80.3 kg)  08/21/23 176 lb 8 oz (80.1 kg)  08/15/23 170 lb (77.1 kg)    Physical Exam  Results for orders placed or performed during the hospital encounter of 03/20/23  Lipase, blood   Collection Time: 03/20/23  2:27 PM  Result Value Ref Range   Lipase 90 (H) 11 - 51 U/L  Comprehensive metabolic panel   Collection Time: 03/20/23  2:27 PM  Result Value Ref Range   Sodium 136 135 - 145 mmol/L   Potassium 4.2 3.5 - 5.1 mmol/L   Chloride 98 98 - 111 mmol/L   CO2 28 22 - 32 mmol/L   Glucose, Bld 88 70 - 99 mg/dL   BUN 10 6 - 20 mg/dL   Creatinine, Ser 8.93 0.61 - 1.24 mg/dL   Calcium 9.4 8.9 - 89.6 mg/dL   Total Protein 7.8 6.5 - 8.1 g/dL   Albumin 4.6 3.5 - 5.0 g/dL   AST 45 (H) 15 - 41  U/L   ALT 45 (H) 0 - 44 U/L   Alkaline Phosphatase 88 38 -  126 U/L   Total Bilirubin 1.0 <1.2 mg/dL   GFR, Estimated >39 >39 mL/min   Anion gap 10 5 - 15  CBC   Collection Time: 03/20/23  2:27 PM  Result Value Ref Range   WBC 6.5 4.0 - 10.5 K/uL   RBC 5.40 4.22 - 5.81 MIL/uL   Hemoglobin 16.1 13.0 - 17.0 g/dL   HCT 52.6 60.9 - 47.9 %   MCV 87.6 80.0 - 100.0 fL   MCH 29.8 26.0 - 34.0 pg   MCHC 34.0 30.0 - 36.0 g/dL   RDW 85.5 88.4 - 84.4 %   Platelets 226 150 - 400 K/uL   nRBC 0.0 0.0 - 0.2 %  Urinalysis, Routine w reflex microscopic -Urine, Clean Catch   Collection Time: 03/20/23  2:27 PM  Result Value Ref Range   Color, Urine YELLOW (A) YELLOW   APPearance CLEAR (A) CLEAR   Specific Gravity, Urine 1.024 1.005 - 1.030   pH 6.0 5.0 - 8.0   Glucose, UA NEGATIVE NEGATIVE mg/dL   Hgb urine dipstick NEGATIVE NEGATIVE   Bilirubin Urine NEGATIVE NEGATIVE   Ketones, ur NEGATIVE NEGATIVE mg/dL   Protein, ur NEGATIVE NEGATIVE mg/dL   Nitrite NEGATIVE NEGATIVE   Leukocytes,Ua NEGATIVE NEGATIVE      Assessment & Plan:   Problem List Items Addressed This Visit   None Visit Diagnoses       Annual physical exam    -  Primary   Relevant Orders   TSH   Lipid panel   Hemoglobin A1c   CBC with Differential/Platelet   Comprehensive metabolic panel with GFR     Screening cholesterol level       Relevant Orders   Lipid panel     Need for hepatitis C screening test       Relevant Orders   Hepatitis C antibody     Screening for HIV (human immunodeficiency virus)       Relevant Orders   HIV Antibody (routine testing w rflx)     Abnormal glucose       Relevant Orders   Hemoglobin A1c     Elevated cholesterol       Relevant Orders   TSH   Lipid panel     Gastroesophageal reflux disease, unspecified whether esophagitis present       Relevant Medications   omeprazole  (PRILOSEC ) 40 MG capsule        Updated Health Maintenance information ***- Reviewed recent lab results  with patient Encouraged improvement to lifestyle with diet and exercise -*** Goal of weight loss  Assessment and Plan Assessment & Plan      Orders Placed This Encounter  Procedures   TSH   Lipid panel    Has the patient fasted?:   Yes   Hemoglobin A1c   CBC with Differential/Platelet   Comprehensive metabolic panel with GFR    Has the patient fasted?:   Yes   HIV Antibody (routine testing w rflx)   Hepatitis C antibody    Meds ordered this encounter  Medications   omeprazole  (PRILOSEC ) 40 MG capsule    Sig: Take 1 capsule (40 mg total) by mouth daily before breakfast.    Dispense:  90 capsule    Refill:  3    Update to 90 day     Follow up plan: Return for 1 year fasting lab > 1 week later Annual Physical.  Marsa Officer, DO [facility] 11/05/2023, 1:47  PM

## 2023-11-06 ENCOUNTER — Other Ambulatory Visit: Payer: PRIVATE HEALTH INSURANCE

## 2023-11-07 ENCOUNTER — Other Ambulatory Visit: Payer: Self-pay

## 2023-11-07 DIAGNOSIS — E7800 Pure hypercholesterolemia, unspecified: Secondary | ICD-10-CM

## 2023-11-07 DIAGNOSIS — Z114 Encounter for screening for human immunodeficiency virus [HIV]: Secondary | ICD-10-CM

## 2023-11-07 DIAGNOSIS — Z0000 Encounter for general adult medical examination without abnormal findings: Secondary | ICD-10-CM

## 2023-11-07 DIAGNOSIS — Z13220 Encounter for screening for lipoid disorders: Secondary | ICD-10-CM

## 2023-11-07 DIAGNOSIS — Z1159 Encounter for screening for other viral diseases: Secondary | ICD-10-CM

## 2023-11-07 DIAGNOSIS — R7309 Other abnormal glucose: Secondary | ICD-10-CM

## 2023-11-11 ENCOUNTER — Other Ambulatory Visit: Payer: Self-pay

## 2023-11-11 ENCOUNTER — Other Ambulatory Visit: Payer: PRIVATE HEALTH INSURANCE

## 2023-11-11 ENCOUNTER — Emergency Department: Payer: PRIVATE HEALTH INSURANCE

## 2023-11-11 ENCOUNTER — Encounter: Payer: Self-pay | Admitting: *Deleted

## 2023-11-11 ENCOUNTER — Emergency Department
Admission: EM | Admit: 2023-11-11 | Discharge: 2023-11-11 | Discharge status: Against Medical Advice | Payer: PRIVATE HEALTH INSURANCE | Attending: Emergency Medicine | Admitting: Emergency Medicine

## 2023-11-11 DIAGNOSIS — F10120 Alcohol abuse with intoxication, uncomplicated: Secondary | ICD-10-CM | POA: Diagnosis present

## 2023-11-11 DIAGNOSIS — W19XXXA Unspecified fall, initial encounter: Secondary | ICD-10-CM | POA: Insufficient documentation

## 2023-11-11 DIAGNOSIS — Y908 Blood alcohol level of 240 mg/100 ml or more: Secondary | ICD-10-CM | POA: Diagnosis not present

## 2023-11-11 DIAGNOSIS — M25561 Pain in right knee: Secondary | ICD-10-CM | POA: Diagnosis not present

## 2023-11-11 DIAGNOSIS — Z5321 Procedure and treatment not carried out due to patient leaving prior to being seen by health care provider: Secondary | ICD-10-CM | POA: Insufficient documentation

## 2023-11-11 LAB — BASIC METABOLIC PANEL WITH GFR
Anion gap: 12 (ref 5–15)
BUN: 10 mg/dL (ref 6–20)
CO2: 29 mmol/L (ref 22–32)
Calcium: 8.9 mg/dL (ref 8.9–10.3)
Chloride: 103 mmol/L (ref 98–111)
Creatinine, Ser: 0.9 mg/dL (ref 0.61–1.24)
GFR, Estimated: >60 mL/min (ref >60)
Glucose, Bld: 101 mg/dL — ABNORMAL HIGH (ref 70–99)
Potassium: 3.8 mmol/L (ref 3.5–5.1)
Sodium: 144 mmol/L (ref 135–145)

## 2023-11-11 LAB — CBC
HCT: 45.6 % (ref 39.0–52.0)
Hemoglobin: 15.7 g/dL (ref 13.0–17.0)
MCH: 30.5 pg (ref 26.0–34.0)
MCHC: 34.4 g/dL (ref 30.0–36.0)
MCV: 88.7 fL (ref 80.0–100.0)
Platelets: 246 K/uL (ref 150–400)
RBC: 5.14 MIL/uL (ref 4.22–5.81)
RDW: 14.7 % (ref 11.5–15.5)
WBC: 6.3 K/uL (ref 4.0–10.5)
nRBC: 0 % (ref 0.0–0.2)

## 2023-11-11 LAB — TROPONIN I (HIGH SENSITIVITY): Troponin I (High Sensitivity): 4 ng/L (ref <18)

## 2023-11-11 LAB — ETHANOL: Alcohol, Ethyl (B): 406 mg/dL (ref <15)

## 2023-11-11 NOTE — ED Notes (Signed)
Pt left with family member.  

## 2023-11-11 NOTE — ED Triage Notes (Addendum)
 Pt brought in via ems from home.  Pt reports drinking tequila for 2 days.  Pt reports falling down and has right knee pain.  Pt reports passing out tonight from drinking.  Pt denies drug use.  Pt denies SI or HI.  Pt denies chest pain.  Pt alert in triage.

## 2023-12-17 ENCOUNTER — Encounter: Payer: PRIVATE HEALTH INSURANCE | Admitting: Family Medicine

## 2023-12-30 ENCOUNTER — Encounter: Payer: PRIVATE HEALTH INSURANCE | Admitting: Family Medicine

## 2024-01-21 ENCOUNTER — Ambulatory Visit: Payer: PRIVATE HEALTH INSURANCE | Admitting: Family Medicine

## 2024-01-27 ENCOUNTER — Ambulatory Visit: Payer: PRIVATE HEALTH INSURANCE | Admitting: Family Medicine

## 2024-04-07 ENCOUNTER — Ambulatory Visit: Payer: PRIVATE HEALTH INSURANCE | Admitting: Family Medicine

## 2024-04-16 ENCOUNTER — Ambulatory Visit: Payer: PRIVATE HEALTH INSURANCE | Admitting: Family Medicine

## 2024-04-20 ENCOUNTER — Ambulatory Visit: Payer: PRIVATE HEALTH INSURANCE | Admitting: Family Medicine

## 2024-04-23 ENCOUNTER — Ambulatory Visit: Payer: PRIVATE HEALTH INSURANCE | Admitting: Family Medicine

## 2024-05-04 ENCOUNTER — Ambulatory Visit: Payer: PRIVATE HEALTH INSURANCE | Admitting: Family Medicine

## 2024-05-06 ENCOUNTER — Ambulatory Visit: Payer: PRIVATE HEALTH INSURANCE | Admitting: Family Medicine

## 2024-05-15 ENCOUNTER — Encounter: Payer: Self-pay | Admitting: Family Medicine

## 2024-05-15 ENCOUNTER — Ambulatory Visit: Payer: PRIVATE HEALTH INSURANCE | Admitting: Family Medicine

## 2024-05-15 VITALS — BP 132/78 | HR 95 | Ht 74.0 in | Wt 174.0 lb

## 2024-05-15 DIAGNOSIS — R030 Elevated blood-pressure reading, without diagnosis of hypertension: Secondary | ICD-10-CM

## 2024-05-15 DIAGNOSIS — L299 Pruritus, unspecified: Secondary | ICD-10-CM | POA: Diagnosis not present

## 2024-05-15 NOTE — Patient Instructions (Addendum)
 Thank you for coming to the office today.  Loratadine 10mg  daily recommendation for allergic response  Mild facial cleanser is recommended for face   Please schedule a Follow-up Appointment to: Return in about 2 months (around 07/13/2024) for 2-3 months Annual Physical, AM fasting labs AFTER.  If you have any other questions or concerns, please feel free to call the office or send a message through MyChart. You may also schedule an earlier appointment if necessary.  Additionally, you may be receiving a survey about your experience at our office within a few days to 1 week by e-mail or mail. We value your feedback.  Marsa Officer, DO [facility], [place]

## 2024-05-15 NOTE — Progress Notes (Unsigned)
 "  Subjective:    Patient ID: Colin Glass, male    DOB: 1985-10-21, 39 y.o.   MRN: [id number]  Colin Glass is a 39 y.o. male presenting on 05/15/2024 for Medical Management of Chronic Issues   HPI  Discussed the use of AI scribe software for clinical note transcription with the patient, who gave verbal consent to proceed.  History of Present Illness   Colin Glass is a 39 year old male who presents with generalized itching.  Generalized pruritus - Generalized itching affecting the entire body, including face and ears - Episodes last three to four days and resolve spontaneously - Recurrence of symptoms within one week of resolution - Onset of itching is sudden, with no identified triggers - No seasonal variation in symptoms - No specific environmental or dietary triggers identified  Cutaneous findings - Occasional bumps on the face associated with itching - Initially suspected to be eczema  Prior and current treatments - Previously prescribed anti-itch medication, but discontinued after one dose due to sedative effects interfering with work - Uses over-the-counter cortisone cream, particularly on the face, with persistent symptoms - No use of specific facial cleansers; considering mild cleansers such as Cetaphil or Cerave      Elevated BP without Hypertension Improved result today without hypertension range - No recent use of home blood pressure monitor since last visit - Recent clinic blood pressure readings have been within normal limits - Not on medication        05/15/2024    3:47 PM 11/05/2023    1:27 PM 08/21/2023    8:24 AM  Depression screen PHQ 2/9  Decreased Interest 0 1 1  Down, Depressed, Hopeless 2 0 0  PHQ - 2 Score 2 1 1   Altered sleeping 0 1 3  Tired, decreased energy 0 0 1  Change in appetite 0  1  Feeling bad or failure about yourself  0 0 0  Trouble concentrating 0 0 0  Moving slowly or fidgety/restless 0 0 0  Suicidal thoughts 0 0 0  PHQ-9  Score 2 2  6    Difficult doing work/chores Not difficult at all Not difficult at all Somewhat difficult     Data saved with a previous flowsheet row definition       05/15/2024    3:47 PM 11/05/2023    1:27 PM 08/21/2023    8:25 AM 11/01/2021    4:05 PM  GAD 7 : Generalized Anxiety Score  Nervous, Anxious, on Edge 0 0 0 1  Control/stop worrying 0 0 1 1  Worry too much - different things 0 1 2 1   Trouble relaxing 0 1 2 0  Restless 0 0 0 0  Easily annoyed or irritable 1 1 1 1   Afraid - awful might happen 0 0 1 1  Total GAD 7 Score 1 3 7 5   Anxiety Difficulty Not difficult at all Not difficult at all Somewhat difficult Not difficult at all    Social History[1]  Review of Systems Per HPI unless specifically indicated above     Objective:    BP 132/78 (BP Location: Left Arm, Patient Position: Sitting, Cuff Size: Normal)   Pulse 95   Ht 6' 2 (1.88 m)   Wt 174 lb (78.9 kg)   SpO2 95%   BMI 22.34 kg/m   Wt Readings from Last 3 Encounters:  05/15/24 174 lb (78.9 kg)  11/11/23 180 lb (81.6 kg)  11/05/23 177 lb 2 oz (80.3  kg)    Physical Exam Vitals and nursing note reviewed.  Constitutional:      General: He is not in acute distress.    Appearance: Normal appearance. He is well-developed. He is not diaphoretic.     Comments: Well-appearing, comfortable, cooperative  HENT:     Head: Normocephalic and atraumatic.  Eyes:     General:        Right eye: No discharge.        Left eye: No discharge.     Conjunctiva/sclera: Conjunctivae normal.  Cardiovascular:     Rate and Rhythm: Normal rate.  Pulmonary:     Effort: Pulmonary effort is normal.  Skin:    General: Skin is warm and dry.     Findings: No erythema or rash.  Neurological:     Mental Status: He is alert and oriented to person, place, and time.  Psychiatric:        Mood and Affect: Mood normal.        Behavior: Behavior normal.        Thought Content: Thought content normal.     Comments: Well groomed, good  eye contact, normal speech and thoughts     Results for orders placed or performed during the hospital encounter of 11/11/23  CBC   Collection Time: 11/11/23  9:25 PM  Result Value Ref Range   WBC 6.3 4.0 - 10.5 K/uL   RBC 5.14 4.22 - 5.81 MIL/uL   Hemoglobin 15.7 13.0 - 17.0 g/dL   HCT 54.3 60.9 - 47.9 %   MCV 88.7 80.0 - 100.0 fL   MCH 30.5 26.0 - 34.0 pg   MCHC 34.4 30.0 - 36.0 g/dL   RDW 85.2 88.4 - 84.4 %   Platelets 246 150 - 400 K/uL   nRBC 0.0 0.0 - 0.2 %  Basic metabolic panel   Collection Time: 11/11/23  9:25 PM  Result Value Ref Range   Sodium 144 135 - 145 mmol/L   Potassium 3.8 3.5 - 5.1 mmol/L   Chloride 103 98 - 111 mmol/L   CO2 29 22 - 32 mmol/L   Glucose, Bld 101 (H) 70 - 99 mg/dL   BUN 10 6 - 20 mg/dL   Creatinine, Ser 9.09 0.61 - 1.24 mg/dL   Calcium 8.9 8.9 - 89.6 mg/dL   GFR, Estimated >39 >39 mL/min   Anion gap 12 5 - 15  Ethanol   Collection Time: 11/11/23  9:25 PM  Result Value Ref Range   Alcohol, Ethyl (B) 406 (HH) <15 mg/dL  Troponin I (High Sensitivity)   Collection Time: 11/11/23  9:25 PM  Result Value Ref Range   Troponin I (High Sensitivity) 4 <18 ng/L      Assessment & Plan:   Problem List Items Addressed This Visit   None Visit Diagnoses       Pruritus    -  Primary     Elevated BP without diagnosis of hypertension            Pruritus Intermittent pruritus with no specific trigger identified. Differential includes allergies, liver, kidney, and thyroid  disease. Previous medication unsuitable due to drowsiness. Non-drowsy antihistamines recommended. Low likelihood of liver or kidney disease, but blood panel needed. - Topical Cortisone OTC for facial areas and can consider rx strength for body but no eczema rash - Trial non-drowsy antihistamines such as loratadine or cetirizine. - Consider allergist referral for skin testing if symptoms persist. - Schedule blood panel to evaluate liver, kidney,  and thyroid  function.  Elevated  BP without hypertension Blood pressure 132/78 mmHg, within normal range. Observational management, no treatment required.  General Health Maintenance Discussion of scheduling physical examination and blood work to update health status. - Schedule physical examination and blood work for March 4th, 2026 at 9:20 AM.        No orders of the defined types were placed in this encounter.   No orders of the defined types were placed in this encounter.   Follow up plan: Return in about 2 months (around 07/13/2024) for 2-3 months Annual Physical, AM fasting labs AFTER.   Marsa Officer, DO [facility] 05/15/2024, 4:13 PM     [1]  Social History Tobacco Use   Smoking status: Former    Current packs/day: 0.00    Average packs/day: 0.5 packs/day for 10.0 years (5.0 ttl pk-yrs)    Types: Cigarettes    Start date: 10/20/2004    Quit date: 10/21/2014    Years since quitting: 9.5   Smokeless tobacco: Never   Tobacco comments:    pt reports currently smoking 1 black and mild a day  Vaping Use   Vaping status: Never Used  Substance Use Topics   Alcohol use: Yes    Alcohol/week: 10.0 standard drinks of alcohol    Types: 10 Standard drinks or equivalent per week    Comment: socially   Drug use: Yes    Types: Marijuana   "

## 2024-07-08 ENCOUNTER — Encounter: Payer: PRIVATE HEALTH INSURANCE | Admitting: Family Medicine

## 2024-11-05 ENCOUNTER — Encounter: Payer: PRIVATE HEALTH INSURANCE | Admitting: Family Medicine
